# Patient Record
Sex: Male | Born: 1965 | Race: White | Hispanic: No | Marital: Married | Smoking: Never smoker
Health system: Southern US, Community
[De-identification: ages and names within clinical notes are randomized; demographics above are authoritative.]

## PROBLEM LIST (undated history)

## (undated) DIAGNOSIS — E875 Hyperkalemia: Secondary | ICD-10-CM

## (undated) DIAGNOSIS — R519 Headache, unspecified: Secondary | ICD-10-CM

## (undated) DIAGNOSIS — Z789 Other specified health status: Secondary | ICD-10-CM

## (undated) DIAGNOSIS — I4891 Unspecified atrial fibrillation: Secondary | ICD-10-CM

## (undated) DIAGNOSIS — Z86718 Personal history of other venous thrombosis and embolism: Secondary | ICD-10-CM

## (undated) DIAGNOSIS — N200 Calculus of kidney: Secondary | ICD-10-CM

## (undated) DIAGNOSIS — M1990 Unspecified osteoarthritis, unspecified site: Secondary | ICD-10-CM

## (undated) DIAGNOSIS — I10 Essential (primary) hypertension: Secondary | ICD-10-CM

## (undated) DIAGNOSIS — Z860101 Personal history of adenomatous and serrated colon polyps: Secondary | ICD-10-CM

## (undated) DIAGNOSIS — R0602 Shortness of breath: Secondary | ICD-10-CM

## (undated) DIAGNOSIS — G629 Polyneuropathy, unspecified: Secondary | ICD-10-CM

## (undated) DIAGNOSIS — Z86010 Personal history of colonic polyps: Secondary | ICD-10-CM

## (undated) DIAGNOSIS — K219 Gastro-esophageal reflux disease without esophagitis: Secondary | ICD-10-CM

## (undated) DIAGNOSIS — G4730 Sleep apnea, unspecified: Secondary | ICD-10-CM

## (undated) DIAGNOSIS — R600 Localized edema: Secondary | ICD-10-CM

## (undated) DIAGNOSIS — E039 Hypothyroidism, unspecified: Secondary | ICD-10-CM

## (undated) DIAGNOSIS — N289 Disorder of kidney and ureter, unspecified: Secondary | ICD-10-CM

## (undated) DIAGNOSIS — K5790 Diverticulosis of intestine, part unspecified, without perforation or abscess without bleeding: Secondary | ICD-10-CM

## (undated) DIAGNOSIS — R51 Headache: Secondary | ICD-10-CM

## (undated) DIAGNOSIS — R6884 Jaw pain: Secondary | ICD-10-CM

## (undated) HISTORY — PX: STAPEDES SURGERY: SHX789

## (undated) HISTORY — PX: TONSILLECTOMY: SUR1361

## (undated) HISTORY — PX: URETHROTOMY: SHX1083

## (undated) HISTORY — DX: Personal history of adenomatous and serrated colon polyps: Z86.0101

## (undated) HISTORY — DX: Personal history of other venous thrombosis and embolism: Z86.718

## (undated) HISTORY — PX: HERNIA REPAIR: SHX51

## (undated) HISTORY — DX: Sleep apnea, unspecified: G47.30

## (undated) HISTORY — DX: Personal history of colonic polyps: Z86.010

## (undated) HISTORY — PX: CARDIOVERSION: SHX1299

## (undated) HISTORY — PX: CHOLECYSTECTOMY: SHX55

## (undated) HISTORY — DX: Headache, unspecified: R51.9

## (undated) HISTORY — DX: Diverticulosis of intestine, part unspecified, without perforation or abscess without bleeding: K57.90

## (undated) HISTORY — DX: Gastro-esophageal reflux disease without esophagitis: K21.9

## (undated) HISTORY — PX: SHOULDER ARTHROSCOPY: SHX128

## (undated) HISTORY — DX: Polyneuropathy, unspecified: G62.9

## (undated) HISTORY — DX: Unspecified osteoarthritis, unspecified site: M19.90

## (undated) HISTORY — DX: Headache: R51

## (undated) HISTORY — DX: Unspecified atrial fibrillation: I48.91

## (undated) HISTORY — DX: Morbid (severe) obesity due to excess calories: E66.01

## (undated) HISTORY — PX: KNEE ARTHROSCOPY: SUR90

## (undated) HISTORY — DX: Calculus of kidney: N20.0

---

## 1998-03-02 ENCOUNTER — Emergency Department (HOSPITAL_COMMUNITY): Admission: EM | Admit: 1998-03-02 | Discharge: 1998-03-02 | Payer: Self-pay | Admitting: Emergency Medicine

## 1998-05-05 ENCOUNTER — Ambulatory Visit (HOSPITAL_COMMUNITY): Admission: RE | Admit: 1998-05-05 | Discharge: 1998-05-05 | Payer: Self-pay | Admitting: Family Medicine

## 1999-08-22 ENCOUNTER — Encounter: Payer: Self-pay | Admitting: *Deleted

## 1999-08-22 ENCOUNTER — Emergency Department (HOSPITAL_COMMUNITY): Admission: EM | Admit: 1999-08-22 | Discharge: 1999-08-22 | Payer: Self-pay | Admitting: Emergency Medicine

## 2000-01-30 ENCOUNTER — Encounter: Admission: RE | Admit: 2000-01-30 | Discharge: 2000-04-29 | Payer: Self-pay | Admitting: Family Medicine

## 2000-07-30 ENCOUNTER — Observation Stay (HOSPITAL_COMMUNITY): Admission: EM | Admit: 2000-07-30 | Discharge: 2000-08-03 | Payer: Self-pay | Admitting: Emergency Medicine

## 2000-08-02 ENCOUNTER — Encounter: Payer: Self-pay | Admitting: *Deleted

## 2000-08-03 ENCOUNTER — Encounter: Payer: Self-pay | Admitting: *Deleted

## 2001-02-14 ENCOUNTER — Observation Stay (HOSPITAL_COMMUNITY): Admission: RE | Admit: 2001-02-14 | Discharge: 2001-02-15 | Payer: Self-pay | Admitting: Urology

## 2001-07-17 ENCOUNTER — Ambulatory Visit (HOSPITAL_BASED_OUTPATIENT_CLINIC_OR_DEPARTMENT_OTHER): Admission: RE | Admit: 2001-07-17 | Discharge: 2001-07-17 | Payer: Self-pay | Admitting: Pulmonary Disease

## 2001-08-02 ENCOUNTER — Encounter: Admission: RE | Admit: 2001-08-02 | Discharge: 2001-08-02 | Payer: Self-pay | Admitting: Occupational Medicine

## 2001-08-02 ENCOUNTER — Encounter: Payer: Self-pay | Admitting: Occupational Medicine

## 2001-08-13 ENCOUNTER — Encounter: Admission: RE | Admit: 2001-08-13 | Discharge: 2001-11-11 | Payer: Self-pay | Admitting: Occupational Medicine

## 2003-10-01 ENCOUNTER — Observation Stay (HOSPITAL_COMMUNITY): Admission: RE | Admit: 2003-10-01 | Discharge: 2003-10-02 | Payer: Self-pay | Admitting: Urology

## 2003-10-07 ENCOUNTER — Emergency Department (HOSPITAL_COMMUNITY): Admission: EM | Admit: 2003-10-07 | Discharge: 2003-10-07 | Payer: Self-pay | Admitting: Emergency Medicine

## 2003-12-23 ENCOUNTER — Inpatient Hospital Stay (HOSPITAL_COMMUNITY): Admission: EM | Admit: 2003-12-23 | Discharge: 2003-12-28 | Payer: Self-pay | Admitting: Emergency Medicine

## 2004-05-18 ENCOUNTER — Ambulatory Visit (HOSPITAL_COMMUNITY): Admission: RE | Admit: 2004-05-18 | Discharge: 2004-05-18 | Payer: Self-pay | Admitting: Otolaryngology

## 2004-05-25 ENCOUNTER — Encounter: Admission: RE | Admit: 2004-05-25 | Discharge: 2004-05-25 | Payer: Self-pay | Admitting: *Deleted

## 2005-10-03 ENCOUNTER — Ambulatory Visit (HOSPITAL_COMMUNITY): Admission: RE | Admit: 2005-10-03 | Discharge: 2005-10-03 | Payer: Self-pay | Admitting: Urology

## 2006-06-11 ENCOUNTER — Emergency Department (HOSPITAL_COMMUNITY): Admission: EM | Admit: 2006-06-11 | Discharge: 2006-06-11 | Payer: Self-pay | Admitting: Family Medicine

## 2006-07-25 ENCOUNTER — Ambulatory Visit: Payer: Self-pay | Admitting: Gastroenterology

## 2006-08-15 ENCOUNTER — Ambulatory Visit: Payer: Self-pay | Admitting: Gastroenterology

## 2006-08-28 ENCOUNTER — Encounter (INDEPENDENT_AMBULATORY_CARE_PROVIDER_SITE_OTHER): Payer: Self-pay | Admitting: *Deleted

## 2006-08-28 ENCOUNTER — Ambulatory Visit (HOSPITAL_COMMUNITY): Admission: RE | Admit: 2006-08-28 | Discharge: 2006-08-28 | Payer: Self-pay | Admitting: Gastroenterology

## 2006-08-30 ENCOUNTER — Ambulatory Visit: Payer: Self-pay | Admitting: Gastroenterology

## 2006-12-02 ENCOUNTER — Emergency Department (HOSPITAL_COMMUNITY): Admission: EM | Admit: 2006-12-02 | Discharge: 2006-12-02 | Payer: Self-pay | Admitting: Emergency Medicine

## 2007-01-21 ENCOUNTER — Encounter (INDEPENDENT_AMBULATORY_CARE_PROVIDER_SITE_OTHER): Payer: Self-pay | Admitting: Surgery

## 2007-01-21 ENCOUNTER — Ambulatory Visit (HOSPITAL_COMMUNITY): Admission: RE | Admit: 2007-01-21 | Discharge: 2007-01-22 | Payer: Self-pay | Admitting: Surgery

## 2007-02-04 ENCOUNTER — Inpatient Hospital Stay (HOSPITAL_COMMUNITY): Admission: EM | Admit: 2007-02-04 | Discharge: 2007-02-08 | Payer: Self-pay | Admitting: Emergency Medicine

## 2007-02-04 ENCOUNTER — Ambulatory Visit: Payer: Self-pay | Admitting: Internal Medicine

## 2007-02-05 ENCOUNTER — Encounter: Payer: Self-pay | Admitting: Internal Medicine

## 2007-02-15 ENCOUNTER — Ambulatory Visit: Payer: Self-pay | Admitting: Gastroenterology

## 2007-06-18 ENCOUNTER — Encounter: Admission: RE | Admit: 2007-06-18 | Discharge: 2007-06-18 | Payer: Self-pay | Admitting: General Surgery

## 2007-07-24 ENCOUNTER — Inpatient Hospital Stay (HOSPITAL_COMMUNITY): Admission: RE | Admit: 2007-07-24 | Discharge: 2007-07-26 | Payer: Self-pay | Admitting: Surgery

## 2007-12-23 ENCOUNTER — Emergency Department (HOSPITAL_COMMUNITY): Admission: EM | Admit: 2007-12-23 | Discharge: 2007-12-23 | Payer: Self-pay | Admitting: Emergency Medicine

## 2008-02-11 ENCOUNTER — Encounter (INDEPENDENT_AMBULATORY_CARE_PROVIDER_SITE_OTHER): Payer: Self-pay | Admitting: *Deleted

## 2008-02-11 ENCOUNTER — Encounter: Admission: RE | Admit: 2008-02-11 | Discharge: 2008-02-11 | Payer: Self-pay | Admitting: Urology

## 2008-02-11 ENCOUNTER — Encounter (INDEPENDENT_AMBULATORY_CARE_PROVIDER_SITE_OTHER): Payer: Self-pay | Admitting: Interventional Radiology

## 2008-02-11 ENCOUNTER — Other Ambulatory Visit: Admission: RE | Admit: 2008-02-11 | Discharge: 2008-02-11 | Payer: Self-pay | Admitting: Interventional Radiology

## 2008-11-23 ENCOUNTER — Telehealth: Payer: Self-pay | Admitting: Gastroenterology

## 2008-11-25 DIAGNOSIS — E039 Hypothyroidism, unspecified: Secondary | ICD-10-CM | POA: Insufficient documentation

## 2008-11-25 DIAGNOSIS — M129 Arthropathy, unspecified: Secondary | ICD-10-CM | POA: Insufficient documentation

## 2008-11-25 DIAGNOSIS — G4730 Sleep apnea, unspecified: Secondary | ICD-10-CM | POA: Insufficient documentation

## 2008-11-25 DIAGNOSIS — K219 Gastro-esophageal reflux disease without esophagitis: Secondary | ICD-10-CM | POA: Insufficient documentation

## 2009-01-05 ENCOUNTER — Encounter: Admission: RE | Admit: 2009-01-05 | Discharge: 2009-01-05 | Payer: Self-pay | Admitting: Urology

## 2009-06-28 ENCOUNTER — Encounter: Admission: RE | Admit: 2009-06-28 | Discharge: 2009-06-28 | Payer: Self-pay | Admitting: Urology

## 2009-07-20 ENCOUNTER — Inpatient Hospital Stay (HOSPITAL_COMMUNITY): Admission: AD | Admit: 2009-07-20 | Discharge: 2009-07-21 | Payer: Self-pay | Admitting: Orthopedic Surgery

## 2009-07-22 ENCOUNTER — Encounter: Admission: RE | Admit: 2009-07-22 | Discharge: 2009-10-20 | Payer: Self-pay | Admitting: Orthopedic Surgery

## 2010-06-27 ENCOUNTER — Encounter: Payer: Self-pay | Admitting: Urology

## 2010-07-07 NOTE — Progress Notes (Signed)
Summary: TRIAGE-ABD. PAIN  Phone Note Call from Patient Call back at Work Phone [phone]   Caller: Patient Call For: Jon Carson  Reason for Call: Talk to Nurse, Privacy/Consent Authorization Summary of Call: dull aching pain x1 week in pelvic area Initial call taken by: Guadlupe Spanish [facility],  November 23, 2008 9:57 AM  Follow-up for Phone Call        Pt. has history of diverticulitis and perf. in 2005.  Pt. c/o a dull ache in lower,mid. abd. X1 week.  Ache has been the same for 1 week. Denies fever, n/v, constipation,diarrhea, blood,black stools.  DR.Natash Berman PLEASE ADVISE Follow-up by: Laureen Ochs LPN,  November 23, 2008 10:11 AM  Additional Follow-up for Phone Call Additional follow up Details #1::        hyomax 0.25 q4h as needed ov in no better next 36 hrs  Additional Follow-up by: Louis Meckel MD,  November 23, 2008 2:36 PM      Appended Document: Med. Update Above MD orders reviewed with patient. Pt. will start Hyomax and see Dr.Londin Antone on 11-27-08. Pt. instructed to call back as needed.    Clinical Lists Changes  Medications: Added new medication of HYOSCYAMINE SULFATE 0.125 MG  SUBL (HYOSCYAMINE SULFATE) Put one under your tongue every 4 hours as needed for abd. pain - Signed Rx of HYOSCYAMINE SULFATE 0.125 MG  SUBL (HYOSCYAMINE SULFATE) Put one under your tongue every 4 hours as needed for abd. pain;  #30 x 2;  Signed;  Entered by: Laureen Ochs LPN;  Authorized by: Louis Meckel MD;  Method used: Faxed to [facility], [address], [place], [place], [place]  [address], Ph: [phone] or [phone], Fax: [phone]    Prescriptions: HYOSCYAMINE SULFATE 0.125 MG  SUBL (HYOSCYAMINE SULFATE) Put one under your tongue every 4 hours as needed for abd. pain  #30 x 2   Entered by:   Laureen Ochs LPN   Authorized by:   Louis Meckel MD   Signed by:   Laureen Ochs LPN on 25/95/6387   Method used:   Faxed to ...       [facility] (retail)        [address]       [place], [place]  [address]       Ph: [phone] or [phone]       Fax: [phone]   RxID:   [id number]

## 2010-08-26 LAB — URINE CULTURE: Colony Count: 55000

## 2010-08-26 LAB — CBC
HCT: 41.1 % (ref 39.0–52.0)
HCT: 45.8 % (ref 39.0–52.0)
Hemoglobin: 14.1 g/dL (ref 13.0–17.0)
Hemoglobin: 15.9 g/dL (ref 13.0–17.0)
MCHC: 34.2 g/dL (ref 30.0–36.0)
MCHC: 34.7 g/dL (ref 30.0–36.0)
MCV: 92 fL (ref 78.0–100.0)
MCV: 92 fL (ref 78.0–100.0)
Platelets: 285 10*3/uL (ref 150–400)
Platelets: 305 10*3/uL (ref 150–400)
RBC: 4.46 MIL/uL (ref 4.22–5.81)
RBC: 4.98 MIL/uL (ref 4.22–5.81)
RDW: 13.2 % (ref 11.5–15.5)
RDW: 13.9 % (ref 11.5–15.5)
WBC: 10.5 10*3/uL (ref 4.0–10.5)
WBC: 8.6 10*3/uL (ref 4.0–10.5)

## 2010-08-26 LAB — BASIC METABOLIC PANEL
BUN: 15 mg/dL (ref 6–23)
CO2: 27 mEq/L (ref 19–32)
Calcium: 8.2 mg/dL — ABNORMAL LOW (ref 8.4–10.5)
Chloride: 101 mEq/L (ref 96–112)
Creatinine, Ser: 0.95 mg/dL (ref 0.4–1.5)
GFR calc Af Amer: >60 mL/min (ref >60)
GFR calc non Af Amer: >60 mL/min (ref >60)
Glucose, Bld: 119 mg/dL — ABNORMAL HIGH (ref 70–99)
Potassium: 3.8 mEq/L (ref 3.5–5.1)
Sodium: 137 mEq/L (ref 135–145)

## 2010-08-26 LAB — COMPREHENSIVE METABOLIC PANEL
ALT: 24 U/L (ref 0–53)
AST: 25 U/L (ref 0–37)
Albumin: 3.9 g/dL (ref 3.5–5.2)
Alkaline Phosphatase: 69 U/L (ref 39–117)
BUN: 13 mg/dL (ref 6–23)
CO2: 29 mEq/L (ref 19–32)
Calcium: 8.8 mg/dL (ref 8.4–10.5)
Chloride: 101 mEq/L (ref 96–112)
Creatinine, Ser: 0.88 mg/dL (ref 0.4–1.5)
GFR calc Af Amer: >60 mL/min (ref >60)
GFR calc non Af Amer: >60 mL/min (ref >60)
Glucose, Bld: 91 mg/dL (ref 70–99)
Potassium: 3.9 mEq/L (ref 3.5–5.1)
Sodium: 138 mEq/L (ref 135–145)
Total Bilirubin: 0.6 mg/dL (ref 0.3–1.2)
Total Protein: 6.9 g/dL (ref 6.0–8.3)

## 2010-08-26 LAB — DIFFERENTIAL
Basophils Absolute: 0 10*3/uL (ref 0.0–0.1)
Basophils Relative: 1 % (ref 0–1)
Eosinophils Absolute: 0.1 10*3/uL (ref 0.0–0.7)
Eosinophils Relative: 1 % (ref 0–5)
Lymphocytes Relative: 37 % (ref 12–46)
Lymphs Abs: 3.2 10*3/uL (ref 0.7–4.0)
Monocytes Absolute: 0.7 10*3/uL (ref 0.1–1.0)
Monocytes Relative: 9 % (ref 3–12)
Neutro Abs: 4.5 10*3/uL (ref 1.7–7.7)
Neutrophils Relative %: 53 % (ref 43–77)

## 2010-08-26 LAB — URINALYSIS, ROUTINE W REFLEX MICROSCOPIC
Bilirubin Urine: NEGATIVE
Glucose, UA: NEGATIVE mg/dL
Hgb urine dipstick: NEGATIVE
Ketones, ur: NEGATIVE mg/dL
Nitrite: NEGATIVE
Protein, ur: NEGATIVE mg/dL
Specific Gravity, Urine: 1.017 (ref 1.005–1.030)
Urobilinogen, UA: 1 mg/dL (ref 0.0–1.0)
pH: 6 (ref 5.0–8.0)

## 2010-08-26 LAB — APTT: aPTT: 29 seconds (ref 24–37)

## 2010-08-26 LAB — PROTIME-INR
INR: 0.99 (ref 0.00–1.49)
Prothrombin Time: 13 seconds (ref 11.6–15.2)

## 2010-10-18 NOTE — H&P (Signed)
NAME:  Jon Carson, STIGGERS            ACCOUNT NO.:  [id number]   MEDICAL RECORD NO.:  [id number]          PATIENT TYPE:  INP   LOCATION:  1308                         FACILITY:  [facility]   PHYSICIAN:  Corwin Levins, MD      DATE OF BIRTH:  09-Mar-1966   DATE OF ADMISSION:  02/03/2007  DATE OF DISCHARGE:                              HISTORY & PHYSICAL   CHIEF COMPLAINT:  Right upper quadrant and right lateral chest pain.   HISTORY OF PRESENT ILLNESS:  Mr. Harbuck is a 45 year old white male  status post laparoscopic cholecystectomy August 18 who is here with the  above.  He is simply too large for CT imaging or VQ scan at this time.  The pain seems sharp with deep breaths and moving about.  It was  intermittent to start with a week ago, but now more constant, dull and  severe.  He has no shortness of breath or lower extremity swelling.  No  fever, as well.  There has been some nausea but no other GI complaints.   PAST MEDICAL HISTORY:  1. History of diverticulitis with spontaneous performation 2005.  2. History of left renal cyst.  3. GERD.  4. Questionable hypertension, mentioned on the record but no evidence      of treatment.  5. Hypothyroidism.  6. DJD.  7. Obstructive sleep apnea with morbid obesity.  8. Questionable asthma.  9. Status post urethrotomy x3.  10.Status post lower extremity joint surgery.  11.Status post ear surgery.   ALLERGIES:  No known drug allergies.   CURRENT MEDICATIONS:  1. Synthroid 0.3 mg p.o. daily.  2. Etodolac 400 mg b.i.d.  3. Prilosec 20 mg p.o. daily p.r.n.   SOCIAL HISTORY:  No tobacco, no alcohol, married, works for the [employer].   FAMILY HISTORY:  Father deceased at age 43 with MI.  Uncle deceased at  29 with MI.  Father with diabetes, hypertension and peripheral vascular  disease.   PHYSICAL EXAMINATION:  VITAL SIGNS:  Blood pressure 148/88, respirations  20, pulse 74, O2 saturation 97%.  HEENT:  Sclerae are clear  TMs clear.   Pharynx benign.  NECK:  Without lymphadenopathy, JVD, thyromegaly.  CHEST:  No rales or wheezes.  CARDIAC:  Regular rate and rhythm.  ABDOMEN:  Soft, nontender, positive bowel sounds.  There is moderate  tenderness of the right lateral chest wall near the right upper quadrant  and right chest.  EXTREMITIES:  No edema.   LABORATORY DATA:  Lipase 22, electrolytes within normal limits.  BUN 16,  creatinine 1.0, glucose 102.  LFTs within normal limits.  UA negative.  White blood cell count 11.7, hemoglobin 12.2. Abdomen ultrasound no  acute disease status post cholecystectomy.  No biliary dilatation or  retained stone.  He has persistent mild splenomegaly, acute abdominal  series with chest x-ray in no acute disease.   ASSESSMENT/PLAN:  1. Right upper quadrant pain/chest pain, question musculoskeletal      versus other.  Most likely musculoskeletal obtained by exam      history, though, cannot rule out right upper quadrant pathology  with his recent surgery or even PE, though, the latter seems      unlikely.  He is to be admitted for IV fluids, pain control and      should likely have GI consultation, gastric problems or need for      EGD.  Will continue a Etodolac p.r.n.  2. Hypothyroidism.  Check TSH.  3. Prophylaxis.  Give Lovenox subcu and PPI therapy.  4. Other medical problems, otherwise, as above.      Corwin Levins, MD  Electronically Signed     JWJ/MEDQ  D:  02/04/2007  T:  02/04/2007  Job:  [id number]   cc:   Bryan Lemma. Manus Gunning, M.D.  Fax: [phone]

## 2010-10-18 NOTE — Op Note (Signed)
NAME:  Jon Carson, Jon Carson            ACCOUNT NO.:  [id number]   MEDICAL RECORD NO.:  [id number]          PATIENT TYPE:  INP   LOCATION:  1535                         FACILITY:  [facility]   PHYSICIAN:  Wilmon Arms. Corliss Skains, M.D. DATE OF BIRTH:  12/30/1965   DATE OF PROCEDURE:  07/24/2007  DATE OF DISCHARGE:                               OPERATIVE REPORT   PREOPERATIVE DIAGNOSES:  1. Ventral incisional hernia.  2. Super morbid obesity.   POSTOPERATIVE DIAGNOSES:  1. Ventral incisional hernia.  2. Super morbid obesity.   PROCEDURE:  Open ventral hernia repair with mesh.   SURGEON:  Wilmon Arms. Corliss Skains, M.D.   ASSISTANT:  Clovis Pu. Cornett, M.D.   ANESTHESIA:  General endotracheal.   INDICATIONS:  The patient is a 45 year old male with super morbid  obesity status post a laparoscopic cholecystectomy in 2008.  His  supraumbilical camera port site was closed with Vicryl suture, but the  patient has developed a ventral hernia in this area.  There is no sign  of incarceration.  A CT scan showed a ventral hernia containing a loop  of nondistended nonobstructed small bowel.  We recommended open repair.   DESCRIPTION OF PROCEDURE:  The patient was brought to the operating  placed and placed in the supine position on the operating room table.  After an adequate level of general anesthesia was obtained,  the  patient's abdomen was shaved, prepped with Betadine, and draped in  sterile fashion.  A timeout was taken to assure the proper patient and  proper procedure.  He had been given preoperative antibiotics.   An elliptical incision was made around his previous incision.  Dissection was carried down into the subcutaneous tissues with cautery.  We identified the hernia sac.  We were able to bluntly dissect around  the entire hernia sac which was fairly large.  We dissected this down to  the fascial opening.  We dissected completely around the fascial opening  and were able to reduce the hernia sac  back into the preperitoneal  space.  We never violated the peritoneum.  The preperitoneal space was  opened up in all directions for several centimeters.  The hernia defect  measured 3 x 6 cm, oriented in a transverse fashion.  We used a 10 x 15-  cm sheet of  Proceed mesh.  This was cut to fit in a round shape.  This  was then placed in the preperitoneal space and secured with multiple  interrupted 0 Prolene sutures.  These were tied down to suspend the mesh  from the posterior surface of the fascia.  The subcutaneous tissues were  then thoroughly irrigated.  The fascia was reapproximated anterior to  the mesh with #1 Prolene.  The subcutaneous tissues were then closed  with 3-0 Vicryl.  Staples were used to close skin.  The patient then had  a clean dressing applied.   He was extubated and brought to the recovery room in stable condition.  An abdominal binder was placed around his abdomen.      Wilmon Arms. Tsuei, M.D.  Electronically Signed     MKT/MEDQ  D:  07/24/2007  T:  07/25/2007  Job:  [id number]   cc:   Bryan Lemma. Manus Gunning, M.D.  Fax: [phone]

## 2010-10-18 NOTE — Op Note (Signed)
NAME:  Jon Carson, Jon Carson            ACCOUNT NO.:  [id number]   MEDICAL RECORD NO.:  [id number]          PATIENT TYPE:  OIB   LOCATION:  1527                         FACILITY:  [facility]   PHYSICIAN:  Wilmon Arms. Corliss Skains, M.D. DATE OF BIRTH:  [birth year]   DATE OF PROCEDURE:  DATE OF DISCHARGE:                               OPERATIVE REPORT   PREOP DIAGNOSIS:  Chronic calculus cholecystitis.   POSTOPERATIVE DIAGNOSIS:  Chronic calculus cholecystitis.   PROCEDURE PERFORMED:  Laparoscopic cholecystectomy with intraoperative  cholangiogram.   SURGEON:  Wilmon Arms. Corliss Skains, M.D., FACS   ASSISTANT:  Lorne Skeens. Hoxworth, M.D.   ANESTHESIA:  General endotracheal.   INDICATIONS:  The patient is a super morbidly obese 45 year old male who  presents with several attacks of severe right upper quadrant pain  radiating through to his back.  This is associated with nausea.  Usually  this is related to eating fried or greasy foods.  Ultrasound showed  gallstones, but no sign of acute cholecystitis.  His preoperative liver  functions were normal.   DESCRIPTION OF PROCEDURE:  The patient was brought to the operating room  and placed in the supine position on the operating room table.  After an  adequate level of general anesthesia was obtained, the patient's abdomen  was prepped with Betadine and draped in sterile fashion.  The patient  has an open, skin lesion on the right side was abdomen; and this was  isolated with an OpSite dressing.  A vertical incision was made 4 cm  above the umbilicus.  The 11-mm OptiVu trocar was then used to advance  into the peritoneal cavity.   Pneumoperitoneum was obtained by insufflating CO2 maintaining a maximum  pressure of 15 mmHg.  The patient was tilted slightly to his left in  reverse Trendelenburg position.  An 11-mm port was placed in the  subxiphoid position.  Two 5-mm ports were placed in the right upper  quadrant.  The gallbladder was grasped with a clamp and  elevated.  The  patient had a large amount of adhesions to the surface of the  gallbladder.  These were taken down with cautery.  The gallbladder was  very large.   The entire operation was very difficult due to the patient's massive  size and having to work against the weight of his abdominal wall.  We  were able to identify the cystic duct.  We dissected around this  circumferentially.  An opening was created on the cystic duct after  ligating with the clip distally.  A Cook cholangiogram catheter was  inserted through a stab incision and threaded into the cystic duct, and  secured a clip.  Cholangiogram was obtained which showed good flow  proximally and distally with no sign of obstruction.  Contrast flowed  into the duodenum.  A catheter was removed.  The cystic duct was ligated  with clips and divided.  The cystic artery was ligated clips and  divided.   Cautery was then used to remove the gallbladder from the liver.  This  was also extremely difficult due to the patient's body habitus as well  as the large size of the gallbladder.  Some bile was spilled, but no  stones were noted.  We were able to dissect the gallbladder free from  the liver and placed it in an EndoCatch sac.  We reinspected the  gallbladder fossa.  Hemostasis was obtained with the cautery.  The right  upper quadrant was then thoroughly irrigated.  Hemostasis was good.  We  then pulled the EndoCatch sac up to the supraumbilical port site.  There  was a large stone in the fundus of the gallbladder which was too large  to allow for the removal of the gallbladder.  We had to enlarge the  fascial opening to remove the gallbladder.  It was sent for pathologic  examination.  The fascia was then closed with a figure-of-eight #0  Vicryl suture.  The skin incisions were closed with 4-0 Monocryl after  all the ports were removed and pneumoperitoneum was released.  Steri-  Strips and clean dressings were applied.  The  patient was then extubated  and brought to the recovery room in stable condition.  All sponge,  instrument, and needle counts were correct.      Wilmon Arms. Tsuei, M.D.  Electronically Signed     MKT/MEDQ  D:  01/21/2007  T:  01/21/2007  Job:  [id number]

## 2010-10-18 NOTE — Discharge Summary (Signed)
NAME:  LOFTON, LEON            ACCOUNT NO.:  [id number]   MEDICAL RECORD NO.:  [id number]          PATIENT TYPE:  INP   LOCATION:  1535                         FACILITY:  [facility]   PHYSICIAN:  Wilmon Arms. Corliss Skains, M.D. DATE OF BIRTH:  Oct 04, 1965   DATE OF ADMISSION:  07/24/2007  DATE OF DISCHARGE:  07/26/2007                               DISCHARGE SUMMARY   ADMISSION DIAGNOSIS:  Ventral incisional hernia.   DISCHARGE DIAGNOSIS:  Ventral incisional hernia.   PROCEDURE:  Open ventral hernia repair with mesh.   SURGEON:  Wilmon Arms. Corliss Skains, M.D.   BRIEF HISTORY:  Mr. Marcum is a 45 year old male with morbid obesity  who underwent a laparoscopic cholecystectomy last year.  He had  developed a hernia at his supraumbilical port site.  CT scan confirmed  that this hernia contained some small bowel but there was no sign of  obstruction.  He presents for elective repair.   HOSPITAL COURSE:  The patient was brought to the operating room on  July 24, 2007.  Through a midline incision  he underwent a mesh  repair of his hernia.  We used a 10 x 10 piece of Proceed mesh secured  with zero Prolene sutures.  Postoperatively the patient has done  reasonably well.  He had some issues with pain control the first night  and was using a PCA.  However, he began ambulating wearing an abdominal  binder.  On postop day #2 he was doing well.  He is being discharged  home with his abdominal binder.  He is given Percocet for pain.   DISCHARGE INSTRUCTIONS:  Percocet p.r.n., follow up in 1 week for staple  removal.  Dr. Fatima Sanger office will call with the appointment.  He should  refrain from any heavy lifting.  He may shower over the dressing and his  wife will change the gauze after the shower.      Wilmon Arms. Tsuei, M.D.  Electronically Signed     MKT/MEDQ  D:  07/26/2007  T:  07/26/2007  Job:  [id number]

## 2010-10-18 NOTE — Discharge Summary (Signed)
NAME:  DIALLO, PONDER            ACCOUNT NO.:  [id number]   MEDICAL RECORD NO.:  [id number]          PATIENT TYPE:  INP   LOCATION:  1308                         FACILITY:  [facility]   PHYSICIAN:  Corinna L. Lendell Caprice, MDDATE OF BIRTH:  1965/11/11   DATE OF ADMISSION:  02/03/2007  DATE OF DISCHARGE:  02/08/2007                               DISCHARGE SUMMARY   DISCHARGE DIAGNOSES:  1. Right upper quadrant and right lateral chest pain, musculoskeletal.  2. Recent laparoscopic cholecystectomy for chronic calculous      cholecystitis.  3. Morbid obesity.  4. Gastroesophageal reflux disease.  5. Hypothyroidism.  6. Obstructive sleep apnea.   DISCHARGE MEDICATIONS:  Continue Synthroid, etodolac, Prilosec as  previous.   ACTIVITY:  No heavy lifting until cleared by Dr. Corliss Skains. Follow up with  Dr. Corliss Skains next week.   PROCEDURES:  None.   CONSULTATIONS:  Dr. Melvia Heaps and Dr. Corliss Skains.   DIET:  Low calorie.   CONDITION:  Stable.   LABORATORY DATA:  Complete metabolic panel significant for an albumin of  3.4 otherwise unremarkable. White blood cell count on admission was  11.7, hemoglobin 12.2, hematocrit 36, platelet count 473.  The white  blood cell count normalized without any antibiotics or other treatment.  Urinalysis showed moderate bilirubin, negative ketone, negative nitrite,  negative leukocyte esterase.  TSH 4.762.   SPECIAL STUDIES AND RADIOLOGY:  Acute abdominal series showed nothing  remarkable. Ultrasound of the abdomen showed no abnormal fluid  collections, common bile duct normal, no intrahepatic ductal dilatation,  left renal cyst, mild splenomegaly. VQ scan showed low probability.  Two  views of the chest showed low lung volumes, mild cardiomegaly and  vascular congestion. HIDA scan showed no evidence of bile leak.   HISTORY AND HOSPITAL COURSE:  Mr. Rubin is a 45 year old, white male  who weighs over 500 pounds and had a recent laparoscopic cholecystectomy  on January 21, 2007.  He presented to the emergency room with severe  right upper quadrant and right lateral chest pain. Please see H&P for  complete admission details.  The pain was sharp and worsened with deep  breaths and any activity.  It had started a week prior to admission and  initially was intermittent but now is constant and severe. He had a  blood pressure of 148/88 but otherwise unremarkable vital signs.  His  lung exam and heart exam were normal.  He had some tenderness in the  right lateral chest wall and right upper quadrant and right chest. The  surgeon on call, Dr. Simona Huh, was contacted by the ED staff and this was  felt to be unlikely a surgical issue and the patient was therefore  admitted to medicine for further workup. Due to his morbid obesity,  getting a CT angiogram to rule out PE was out of the question. Luckily  we were able to get a VQ scan at [facility]. This showed low  probability. Surgery was contacted by Dr. Suanne Marker.  She spoke with Dr.  Derrell Lolling who recommended getting a GI consult and that this was unlikely  to be related to surgery  but the patient may need an ERCP. [facility]  was consulted and recommended a HIDA scan. Also Dr. Corliss Skains consulted. The  patient did have some drainage from his periumbilical incision.  There  was no signs of infection. HIDA scan showed no bile leak. The patient's  pain had improved and it is felt to be musculoskeletal in nature.      Corinna L. Lendell Caprice, MD  Electronically Signed     CLS/MEDQ  D:  02/08/2007  T:  02/08/2007  Job:  [id number]   cc:   Bryan Lemma. Manus Gunning, M.D.  Fax: [phone]   Barbette Hair. Arlyce Dice, MD,FACG  [address]  [place]  [place] [address]   Wilmon Arms. Corliss Skains, M.D.  [address] [place] [phone]  [place]

## 2010-10-21 NOTE — Assessment & Plan Note (Signed)
[facility]                         GASTROENTEROLOGY OFFICE NOTE   NAME:Jon Carson, [name]                   MRN:          [id number]  DATE:07/25/2006                            DOB:          02-25-1966    PROBLEM:  History of diverticulitis.   Mr. Verrilli is a pleasant, 45 year old white male, here for evaluation  of his diverticular disease.  He suffered a spontaneous perforation of a  diverticulum in 2005.  He was treated medically.  He has been on a low-  fiber diet since.  He has had no subsequent episodes of abdominal pain.  He has occasional pyrosis for which he takes omeprazole.   FAMILY HISTORY:  Pertinent for his grandfather, who had colon cancer.   PAST MEDICAL HISTORY:  Pertinent for thyroid disease, arthritis and  sleep apnea.  He is status post urethrotomy.   MEDICATIONS INCLUDE:  Omeprazole, etodolac and Synthroid.   ALLERGIES:  He has no allergies.   SOCIAL HISTORY:  He neither smokes nor drinks.  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   REVIEW OF SYSTEMS:  Positive for feet swelling, joint pains and back  pain.   PHYSICAL EXAMINATION:  He is an obese male.  Pulse 68, blood pressure  130/84, weight 481.  HEENT: EOMI. PERRLA. Sclerae are anicteric.  Conjunctivae are pink.  NECK:  Supple without thyromegaly, adenopathy or carotid bruits.  CHEST:  Clear to auscultation and percussion without adventitious  sounds.  CARDIAC:  Regular rhythm; normal S1 S2.  There are no murmurs, gallops  or rubs.  ABDOMEN:  Bowel sounds are normoactive.  Abdomen is soft, non-tender and  non-distended.  There are no abdominal masses, tenderness, splenic  enlargement or hepatomegaly.  EXTREMITIES:  Full range of motion.  No cyanosis, clubbing or edema.  RECTAL:  Deferred.   IMPRESSION:  1. History of perforated diverticulitis.  2. Morbid obesity.   RECOMMENDATION:  1. High-fiber diet.  2. Colonoscopy to assess  degree and severity of his diverticular      disease.  3. Patient was counseled about weight-loss and consideration for      bariatric surgery.     Barbette Hair. Arlyce Dice, MD,FACG  Electronically Signed    RDK/MedQ  DD: 07/25/2006  DT: 07/25/2006  Job #: [id number]   cc:   Bryan Lemma. Manus Gunning, M.D.

## 2010-10-21 NOTE — Op Note (Signed)
NAME:  Jon Carson, Jon Carson            ACCOUNT NO.:  [id number]   MEDICAL RECORD NO.:  [id number]          PATIENT TYPE:  AMB   LOCATION:  DAY                          FACILITY:  [facility]   PHYSICIAN:  Martina Sinner, MD DATE OF BIRTH:  07-Dec-1965   DATE OF PROCEDURE:  10/03/2005  DATE OF DISCHARGE:                                 OPERATIVE REPORT   PREOPERATIVE DIAGNOSIS:  Urethral stricture.   POSTOPERATIVE DIAGNOSIS:  Urethral stricture.   SURGERY:  Cystoscopy, retrograde urethrogram, and balloon dilation of  urethral stricture.   Mr. Summerlin has a symptomatic stricture that was dilated by Dr. Etta Grandchild a  few weeks ago to 16-French.  He has had three urethrotomies.  He is  approximately 450 pounds.  I decided to, under anesthesia, ,examine him and  get the length and caliber of the stricture.   The patient is prepped and draped in the usual fashion.  Initially, a 36-  Jamaica scope was utilized for the examination.  His entire penile and bulbar  urethra was very pale.  The caliber appeared to be nearly within normal  limits or within normal limits.  It was not fibrotic.  I recognized that he  was just dilated but the caliber looked reasonably normal.  He had an  erection during the procedure and because of that and his obesity, he was  actually difficult the cystoscope.  I finally did scope him to what I  thought was the proximal bulbar urethra where he had probably a 14-French  stricture near the membranous urethra.  I then did a retrograde urethrogram.  He looked to have a mild narrowing near the membranous urethra, but no other  obvious stricture.  The details of the retrograde urethrogram are dictated  below.   Under cystoscopic and fluoroscopic guidance, I then passed a sensor wire  into the bladder.  I radiographically placed the balloon dilation catheter  across the stricture.  I dilated to atmospheres of pressure for five  minutes.  I then re-cystoscoped the patient with  the wire still in place.  I  could easily pass through the urethra up through the membranous urethra to  the level of the verumontanum and bladder neck.  Because of the erection, I  could not enter the bladder with the regular length cystoscope.   I had a good look at his anatomy.  The prostatic urethra and verumontanum  was normal.  There is no question that the stricture was within the  membranous urethra.  It may have extended 0.5 cm distal to the sphincter but  it was primarily an intersphincteric stricture.  It was dilated visually to  approximately 22-24 [place].  The rest of the urethra, again, was open,  healthy, but pale-looking.  There is no other trauma to the urethra.  I  catheterized him fairly easily with an 18-French rubber catheter.  An 88-  Jamaica coude Foley catheter went in more easily.  I decided to leave it in  overnight so that he does not have any trouble with painful retention  postoperatively.   In my opinion, he did not have  obvious EXO of the glans penis.  Having said  that, he had a very pale urethra throughout its length.  He had reasonable  penile length for skin usage.  I did not put his legs in the high lithotomy  position but I did palpate his perineum.  I could palpate the Foley catheter  in place.   Mr. Ruggiero is going to have the catheter in overnight.  I will give him a  week of ciprofloxacin.  We are going to teach him how to self catheterize  himself and possibly have his wife help because of his body habitus.           ______________________________  Martina Sinner, MD  Electronically Signed     SAM/MEDQ  D:  10/03/2005  T:  10/03/2005  Job:  [id number]

## 2010-10-21 NOTE — Op Note (Signed)
[facility]  Patient:    Jon Carson, Jon Carson Visit Number: [id number] MRN: [id number]          Service Type: DSU Location: DAY Attending Physician:  Monica Becton Dictated by:   Claudette Laws, M.D. Proc. Date: 02/14/01 Admit Date:  02/14/2001                             Operative Report  PREOPERATIVE DIAGNOSES: 1. 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 2. Remote history of urethral stricture disease.  POSTOPERATIVE DIAGNOSES: 1. 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 2. Remote history of urethral stricture disease.  OPERATION PERFORMED:  Cystourethroscopy and urethral dilation with Sissy Hoff sounds.  SURGEON:  Claudette Laws, M.D.  HISTORY OF PRESENT ILLNESS:  This is a 45 year old patient who weighs over 400 pounds who I had performed a direct vision urethrotomy on six or seven years ago for urethral stricture disease. He was lost to follow-up and basically did well until recently he noticed some splaying of his stream. I saw him in the office recently and examined him and he appeared to have a meatal stenosis and some whitish discoloration possibly consistent with early BXO. A decision was made to put him to sleep and reevaluate him and possibly do another direct vision urethrotomy if indicated or urethral dilation depending upon the findings, possible meatotomy. All of this was explained to the patient. He understands and agrees to the proposed surgery.  DESCRIPTION OF PROCEDURE:  The patient was prepped and draped in the dorsal lithotomy position under intubated anesthesia. Examination again revealed a rather tight meatus; however, I was able to dilate this with [employer] sounds to a #28 [place]. The urethra was somewhat hemorrhagic. Then under direct vision, I initially passed a 17 Jamaica cystoscope. I was able to finally with the use of a guidewire negotiate my way into the bladder.  The urethra itself was somewhat snug throughout but there was no obvious areas I thought which warranted direct vision urethrotomy. The bladder itself looked normal. Grossly normal tumors nor calculi. Normal ureteral orifices. A small nonobstructing prostate, short prostatic urethra.  At this point, I thought that in view of the fact of these findings that maybe I would just dilate him with Sissy Hoff sounds and dilate the urethra and not do anything else at this time but follow him along. 10 cc of xylocaine jelly was instilled into the urethra. He was then taken back to the recovery room in satisfactory condition. Dictated by:   Claudette Laws, M.D. Attending Physician:  Monica Becton DD:  02/14/01 TD:  02/14/01 Job: [id number]

## 2010-10-21 NOTE — Discharge Summary (Signed)
NAME:  Jon Carson, Jon Carson                      ACCOUNT NO.:  [id number]   MEDICAL RECORD NO.:  [id number]                   PATIENT TYPE:  OBV   LOCATION:  0357                                 FACILITY:  [facility]   PHYSICIAN:  Claudette Laws, M.D.               DATE OF BIRTH:  13-Apr-1966   DATE OF ADMISSION:  10/01/2003  DATE OF DISCHARGE:  10/02/2003                                 DISCHARGE SUMMARY   HISTORY:  This is a 45 year old gentleman with a long history of urethral  stricture disease.  In the past, he has undergone two cystoscopies, urethral  dilatations, and urethrotomies under anesthesia.  Patient, because of his  massive obesity, has proven to be very difficult to cystoscope, even under  anesthesia.  Recently, he has developed some hesitancy and some burning.  I  tried to cystoscope him in the office without any success.  He comes back  now for another attempt at direction-vision urethrotomy under anesthesia.  The patient otherwise, other than the obesity, is in fairly good health.  He  uses a CPAP at bedtime.  Patient also has a history of hypertension.   PERTINENT LABORATORY DATA:  Electrolytes were normal with a BUN of 15,  creatinine of 1.0.  Hemoglobin 12.4, hematocrit 37.6.   EKG showed normal sinus rhythm with sinus arrhythmia.   HOSPITAL COURSE:  The patient came in on the morning of surgery and  underwent what proved to be a rather difficult direct-vision urethrotomy;  however, I was eventually able to negotiate a 17 French cystoscope into the  bladder.  The patient has a snug urethra throughout its course.  Otherwise,  the bladder neck itself was wide open.  The bladder itself looked grossly  normal.  At the conclusion, I was able to place a #16 [place] coude-tip  catheter, and the patient was then observed overnight because of his sleep  apnea.  He did well and was sent home the next morning.  The plan now is to  serially dilate him with catheters in the office.   He is to come back, now,  in three days for followup.   FINAL DIAGNOSES:  1. Chronic urethral stricture disease.  2. Massive obesity.  3. Hypertension.   OPERATION:  Cystoscopy and direct-vision urethrotomy.   COMPLICATIONS:  None.   CONDITION ON DISCHARGE:  Stable.   DISCHARGE MEDICATIONS:  To include all of his home medications plus his CPAP  plus Cipro XR 500 mg 1 daily #5 and also Tylox #25 p.r.n. pain.   DISPOSITION:  1. A regular diet.  2. Force fluids.  3. Catheter to a leg bag.  4. See me in the office in three days.  Claudette Laws, M.D.    RFS/MEDQ  D:  10/02/2003  T:  10/02/2003  Job:  [id number]

## 2010-10-21 NOTE — Discharge Summary (Signed)
NAME:  Jon Carson, Jon Carson            ACCOUNT NO.:  [id number]   MEDICAL RECORD NO.:  [id number]          PATIENT TYPE:  INP   LOCATION:  5707                         FACILITY:  [facility]   PHYSICIAN:  Vikki Ports, M.D.DATE OF BIRTH:  1965/07/12   DATE OF ADMISSION:  12/23/2003  DATE OF DISCHARGE:  12/28/2003                                 DISCHARGE SUMMARY   ADMISSION DIAGNOSES:  Ruptured diverticulitis.   DISCHARGE DIAGNOSES:  Ruptured diverticulitis.   CONDITION ON DISCHARGE:  Good and improved.   DISPOSITION:  Discharged to home.   FOLLOW UP:  With me in 1 to 2 weeks.   DISCHARGE MEDICATIONS:  1.  Ciprofloxacin 500 mg twice a day.  2.  Flagyl 500 mg three times a day.   HOSPITAL COURSE:  The patient is a 45 year old massively, morbidly obese  white male who was having significant abdominal pain for 5 to 6 days prior  to admission. He was admitted after being on outpatient Ciprofloxacin. CT  scan showed localized perforation in the left lower quadrant.  Clinically, the patient was very benign. Had a minimal white count of 12,000  and was afebrile, feeling well and was hungry. He was treated with  intravenous antibiotics over the next 4 to 5 days. He progressed nicely. Was  tolerating a low-residue, low-fiber diet and on postoperative day 5, was  ready for discharge home. He is going to followup with me, understanding  that with weight loss, I would be willing to do a staged laparoscopic  colectomy. He will followup with me in 2 to 3 weeks.       KRH/MEDQ  D:  02/27/2004  T:  02/28/2004  Job:  [id number]   cc:   Carren Rang, M.D.   Geoffry Paradise, M.D.  [address]  [place]  [place] [address]  Fax: [phone]

## 2010-10-21 NOTE — Op Note (Signed)
NAME:  RIAD, WAGLEY NO.:  [id number]   MEDICAL RECORD NO.:  [id number]          PATIENT TYPE:  OIB   LOCATION:  2899                         FACILITY:  [facility]   PHYSICIAN:  Kristine Garbe. 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   OPERATION:  Exploratory left tympanotomy with stapes mobilization.   SURGEON:  Kristine Garbe. Ezzard Standing, M.D.   ANESTHESIA:  General.   COMPLICATIONS:  None.   BRIEF CLINICAL NOTE:  Jon Carson is a 45 year old gentleman who has  had a gradual progressive hearing loss over the last three or four years in  his left ear.  Audiologic testing in the office demonstrated a 20-30 dB  conductive hearing loss in the left ear with some mild underlying  downsloping sensorineural hearing loss.  His speech perception threshold in  the left ear was 60 dB.  Speech perception in the right ear was 25 dB with  no conductive hearing loss.  He had bilateral type A tympanograms.  I  discussed with him concerning the conductive hearing loss to consider  exploratory tympanotomy and possible stapedectomy depending on findings.  He  is taken to the operating room at this time for exploratory tympanotomy and  possible stapedectomy.   DESCRIPTION OF PROCEDURE:  After adequate endotracheal anesthesia, the left  ear was prepped with Betadine solution and draped out with sterile towels.  A posterior-based tympanomeatal flap was elevated down to the annulus.  Cotton pledgets soaked in adrenalin were placed for hemostasis.  The annulus  was then entered and the middle ear space was opened.  The middle ear space  was dry.  I could visualize the tip of the incus and a portion of  the stapes  superstructure, although the patient had some overhanging bone that would  require a curette to visualize the stapedial tendon and the facial nerve.  Both the stapes was fixed with poor mobility.  Using a curette, the bone was  curetted on the posterior superior aspect.  The chorda tympani was  preserved, although during curetting it was stretched.  After curetting the  bone and obtaining the visualization of the facial nerve and stapedial  tendon, again the stapes was examined.  Still there was limited access  because of the mild overhanging bone after curetting as well as patient's  size.  Because of the limited visualization, it was elected to perform a  stapes mobilization.  Using a Rosen pick, the stapes was mobilized back and  forth until it seemed to have good mobility.  The IS joint was not  separated.  Following completion of the mobilization, the stapes seemed to  have much better mobility.  This completed the procedure.  The tympanomeatal  flap was brought back down.  The ear was packed with Gelfoam soaked in Coly-  Mycin and bacitracin ointment.  A cotton ball  and Band-Aid were placed over  the left ear.  Jon Carson tolerated this well, was awoken from anesthesia and  transferred to the recovery room postop doing well.   DISPOSITION:  Jon Carson will be discharged home either later this evening or  tomorrow morning.  The patient will be discharged home on Tylenol and  Vicodin p.r.n. pain.  Will have him follow up in my office in one week for  recheck and cleaning the ear canal.  I discussed with Jon Carson and his wife  concerning the stapes mobilization and probable need for further surgery  depending on hearing results down the road.       CEN/MEDQ  D:  05/18/2004  T:  05/18/2004  Job:  [id number]

## 2010-10-21 NOTE — Letter (Signed)
July 25, 2006    Legrand Como   RE:  HARALAMBOS, YEATTS  MRN:  [id number]  /  DOB:  31-Mar-1966   Dear Mr. Steller:   It is my pleasure to have treated you recently as a new patient in my  office.  I appreciate your confidence and the opportunity to participate  in your care.   Since I do have a busy inpatient endoscopy schedule and office schedule,  my office hours vary weekly.  I am, however, available for emergency  calls every day through my office.  If I cannot promptly meet an urgent  office appointment, another one of our gastroenterologists will be able  to assist you.   My well-trained staff are prepared to help you at all times.  For  emergencies after office hours, a physician from our gastroenterology  section is always available through my 24-hour answering service.   While you are under my care, I encourage discussion of your questions  and concerns, and I will be happy to return your calls as soon as I am  available.   Once again, I welcome you as a new patient and I look forward to a happy  and healthy relationship.    Sincerely,      Barbette Hair. Arlyce Dice, MD,FACG  Electronically Signed   RDK/MedQ  DD: 07/25/2006  DT: 07/25/2006  Job #: [id number]

## 2010-10-21 NOTE — Op Note (Signed)
NAME:  Jon Carson, Jon Carson            ACCOUNT NO.:  [id number]   MEDICAL RECORD NO.:  [id number]          PATIENT TYPE:  AMB   LOCATION:  DAY                          FACILITY:  [facility]   PHYSICIAN:  Martina Sinner, MD DATE OF BIRTH:  May 07, 1966   DATE OF PROCEDURE:  10/03/2005  DATE OF DISCHARGE:                                 OPERATIVE REPORT   PREOPERATIVE DIAGNOSIS:  Urethral stricture.   POSTOPERATIVE DIAGNOSIS:  Urethral stricture.   SURGERY:  Retrograde urethrogram.   SURGEON:  Martina Sinner, MD   DESCRIPTION OF PROCEDURE:  During cystoscopy, I performed a retrograde  urethrogram to outline the length and position of his stricture.  He  appeared to have about a 14-French narrowing near the membranous urethra.  The proximal penile urethra and bulbar urethra were normal.  Dye entered the  bladder.  I then balloon-dilated the patient's stricture.           ______________________________  Martina Sinner, MD  Electronically Signed     SAM/MEDQ  D:  10/03/2005  T:  10/04/2005  Job:  [id number]

## 2010-10-21 NOTE — Consult Note (Signed)
[facility]  Patient:    Carson, Jon                   MRN: [id number] Proc. Date: 08/01/00 Adm. Date:  [id number] Attending:  Jetty Duhamel T                          Consultation Report  REFERRING PHYSICIAN:  Jetty Duhamel, M.D.  REASON FOR CONSULTATION:  Chest pain.  HISTORY OF PRESENT ILLNESS:  Jon Carson is a 45 year old morbidly obese male who presented with the onset of chest pain for approximately 24 hours prior to presentation.  The pain was described as intermittent and was thought to be related to indigestion.  The patient self-treated himself at home with Pepto-Bismol with questionable relief.  The pain appeared to be aggravated by exercise and improved with rest.  He presented to the emergency room by the EMS service.  The pain was significantly relieved by O2 therapy.  The patient has had significant stress recently as he has travelled to [place] for a funeral.  Coronary risk factors are significant for male sex, morbid obesity, family history.  FAMILY HISTORY:  Mother passed at age 74 from myocardial infarction.  Maternal uncle at 43 years of age from myocardial infarction.  No history of hypertension.  He is a nonsmoker.  No diabetes.  Father has diabetes, hypertension, peripheral vascular disease.  PAST MEDICAL HISTORY:  Significant for morbid obesity, hypothyroidism, sleep apnea.  Past injuries are none.  REVIEW OF SYSTEMS:  He has had increasing pedal edema.  He has sleep apnea for which he must wear a C-Pap mask.  No change in bowel or bladder habits.  No tachyarrhythmia.  No palpitations.  No presyncope.  No syncope.  CURRENT MEDICATIONS: 1. Synthroid 0.2 plus 0.088 q.d. 2. Celebrex 200 mg q.d. 3. Glucosamine and C-Pap for sleep apnea.  PAST SURGICAL HISTORY:  None.  PHYSICAL EXAMINATION:  GENERAL:  Reveals a morbidly obese male.  Stated weight is 450 pounds.  VITAL SIGNS:  Blood pressure is  148/76, heart rate is 84.  His O2 saturation has been more than 90% on room air.  He is afebrile.  HEENT:  Unremarkable for xanthelasma.  NECK:  He has good carotid upstrokes.  There are no audible bruits.  Unable to evaluate for neck pain distention or thyroid.  LUNGS:  Pulmonary exam is made difficult by morbid obesity.  Breath sounds appear to be equal.  No use of accessory muscles.  CARDIOVASCULAR:  Heart tones are distant.  Regular rate and rhythm.  ABDOMEN:  Morbidly obese.  Unable to palpate for hepatosplenomegaly.  Normal bowel sounds.  EXTREMITIES:  There is 1+ pitting edema.  NEUROLOGICAL:  Nonfocal.  Orlene Erm is appropriate.  Motor is made difficult by his morbid obesity.  LABORATORY DATA:  Serial CKs have been negative.  Troponin Is have been negative.  Hematocrit is 41.9, platelet count is 376,000.  A pH of 7.41, pCO2 is 43, creatinine is 0.9, potassium is 3.7.  LDL is 136 and this was a nonfasting cholesterol profile.  Chest x-ray reveals a chronic obstructive pulmonary disease.  ECG reveals a normal sinus rhythm, normal ECG, normal R-wave progression.  IMPRESSION: 1. Chest pain, by history sounds typical for angina.  The patient has been    pain-free since his hospitalization.  He has significant risk factors.  A    weight will be obtained on the patient.  If his weight is less than 450    pounds, we will proceed with an Adenosine-Cardiolite.  If his weight is    more than 450 pounds, we would be unable to proceed with heart    catheterization.  As such, he would be treated as if he had known coronary    artery disease with the treatment of his LDL for a cholesterol less    than 100, aspirin, and initiation of beta blockers. 2. Morbid obesity:  The patient is aware of his risk of pulmonary    hypertension from his morbid obesity. 3. Questionable pulmonary embolus:  This has been eliminated by VQ scan    which was normal.  RECOMMENDATIONS:  Further  recommendations will be pending the outcome of the patients actual weight and studies. DD:  08/01/00 TD:  08/01/00 Job: [id number]

## 2010-10-21 NOTE — H&P (Signed)
NAME:  Jon Carson, Jon Carson                      ACCOUNT NO.:  [id number]   MEDICAL RECORD NO.:  [id number]                   PATIENT TYPE:  INP   LOCATION:  1825                                 FACILITY:  [facility]   PHYSICIAN:  Vikki Ports, M.D.         DATE OF BIRTH:  12/02/1965   DATE OF ADMISSION:  12/23/2003  DATE OF DISCHARGE:                                HISTORY & PHYSICAL   REFERRING PHYSICIAN:  Carren Rang, M.D.   REFERRAL DIAGNOSES:  1. Abdominal pain.  2. Perforated diverticulum.   HISTORY OF PRESENT ILLNESS:  The patient is a 45 year old morbidly-obese  white male who began having abdominal pain approximately five to six days  prior to presentation to his primary care physician.  He was seen by  Dr. Geoffry Paradise on Monday and started on ciprofloxacin.  The patient  began to have worsening discomfort over the last day, and presented to the  emergency room.  Workup here revealed a white count of 12,000 and a  temperature of 100 degrees.  CT scan showed a localized perforation of the  distal sigmoid colon, and suprapubic region.  There was extraluminal air.  No fluid.  No evidence of abscess.  The patient's pain has been pretty much  unchanged since this morning.  He denies chills. He is hungry and had his  last meal late last night.   PAST MEDICAL HISTORY:  Significant for mild hypertension, hypothyroidism,  gastroesophageal reflux disease, obstructive sleep apnea and seasonal  asthma.   MEDICATIONS:  1. Cipro 500 mg b.i.d.  2. Synthroid 300 mg once a day.  3. Furosemide 20 mg once a day.  4. Omeprazole 20 mg once a day.  5. Claritin D p.r.n.   PAST SURGICAL HISTORY:  Significant only for joint surgery of the lower  extremities.  He has had no abdominal surgery.   ALLERGIES:  None.   REVIEW OF SYSTEMS:  Significant as above.   PHYSICAL EXAMINATION:  GENERAL:  An age-appropriate, morbidly-obese white  male.  He is alert and oriented and in no  distress.  He is sitting up and  talking with his brother-in-law and wife.  VITAL SIGNS:  His weight is measured today at 452 pounds.  His blood  pressure on admission is 97/40.  It is now currently 127/80.  His heart rate  is 84.  ABDOMEN:  He does complain of 2/10 abdominal pain to my questioning.  Abdomen is morbidly obese, with no hepatosplenomegaly appreciated.  No  abdominal wall hernia defects.  He is tender in the suprapubic region and in  the left lower quadrant.  He does have some moderate voluntary guarding, but  involuntary guarding, and no evidence of peritonitis.  HEENT:  Exam is normocephalic, atraumatic.  NECK:  He has a very thick neck.  Carotids are distally palpable.  LUNGS:  Clear to auscultation and percussion x2.  HEART:  Distant heart sounds, but is regular rate and rhythm  without No  murmurs rubs, or gallops.  EXTREMITIES:  Exam shows no cyanosis, clubbing, edema or deformity.   LABORATORY DATA:  Laboratory values show a white count of 12,000.  BMET is  essentially normal.   CT scan is reviewed and shows some mild-to-moderate extraluminal area in the  mid portion, above the bladder.  No free fluid.  No abscess.   IMPRESSION:  Localized perforated diverticulitis in a morbidly-obese white  male.   PLAN:  After a very long discussion with him and his wife, I explained the  risk of surgical intervention at this time would certainly mean a colostomy  which would have tenuous viability secondary to the width of his abdominal  wall.  I also explained the increased risk of dehiscence and wound infection  secondary to his weight as well as the risk of respiratory dependency  secondary to his obstructive sleep apnea.  After a long discussion with the  patient and his clinical experience which is nontoxic at this time, I think  a trial of IV antibiotics and close observation in the hospital with  possible delayed one stage sigmoid resection is the best option for him.   Certainly with any decline in clinical appearance, increased leukocytosis or  worsening pain with the patient, he would require surgical intervention  including a sigmoid colectomy with colostomy and staged procedure.  He and  his wife understand and wish to proceed in this direction.                                                Vikki Ports, M.D.    KRH/MEDQ  D:  12/23/2003  T:  12/23/2003  Job:  [id number]   cc:   Geoffry Paradise, M.D.  [address]  [place]  [place] [address]  Fax: [phone]

## 2010-10-21 NOTE — Op Note (Signed)
NAME:  Jon Carson, Jon Carson                      ACCOUNT NO.:  [id number]   MEDICAL RECORD NO.:  [id number]                   PATIENT TYPE:  AMB   LOCATION:  DAY                                  FACILITY:  [facility]   PHYSICIAN:  Claudette Laws, M.D.               DATE OF BIRTH:  03/11/1966   DATE OF PROCEDURE:  10/01/2003  DATE OF DISCHARGE:                                 OPERATIVE REPORT   PREOPERATIVE DIAGNOSIS:  Urethral stricture disease, rule out balanitis  xerotica obliterans (BXO).   POSTOPERATIVE DIAGNOSIS:  Urethral stricture disease, rule out balanitis  xerotica obliterans (BXO).   OPERATION/PROCEDURE:  Cystourethroscopy with a direct vision urethrotomy.   SURGEON:  Claudette Laws, M.D.   DESCRIPTION OF PROCEDURE:  The patient was prepped and draped in the dorsal  lithotomy position under intubated general anesthesia.  His meatus  calibrated normally today to a 28-French without difficulty.  There was some  slight whitish discoloration, thinning of the meatus, possibly consistent  with early BXO.  Then using the 17-French cystoscope, urethroscopy was  performed.  He was found to be have a soft stricture in the deep bulbous  urethra.  This patient has a long urethra which is noted throughout.  There  was no definite tiny concentric stricture.  However, I was never able to  negotiate my way past the membranous urethra.  We attempted to pass a  guidewire into the bladder and this proved to be difficult.  Finally I was  able to negotiate a guidewire into the bladder but I was never able to  dilate him with Heyman sounds.  I then went back in with the direct vision  urethrotome and performed a cold cup urethrotomy at the 12 o'clock position  in the deep bulbous urethra.  Finally, this enabled me to negotiate my way  into the bladder with the 17-French cystoscope.  The bladder itself looked  grossly normal, somewhat thickened.  No tumors, no calculi.  Then on the way  out, he  appeared to have a snug urethra throughout.  I was never able to get  the urethrotome deep into the bulbous urethra.  At this point, I elected to  put a catheter in and so I was able to introduce a 16-French coude-tip  Foley, irrigated well.  A B&O suppository was placed per rectum for  anesthetic purposes, and he was taken back to the recovery room in  satisfactory condition.   The plan now is to keep his catheter in for a few days and hopefully this  will dilate the urethra.  I may have him come back to the office and try to  dilate him then in a few days after catheter dilation.  Claudette Laws, M.D.    RFS/MEDQ  D:  10/01/2003  T:  10/01/2003  Job:  [id number]

## 2011-01-04 ENCOUNTER — Other Ambulatory Visit: Payer: Self-pay | Admitting: Dermatology

## 2011-02-24 LAB — BASIC METABOLIC PANEL
BUN: 17
BUN: 15
CO2: 30
CO2: 30
Calcium: 8.6
Calcium: 7.9 — ABNORMAL LOW
Chloride: 104
Chloride: 100
Creatinine, Ser: 0.85
Creatinine, Ser: 0.79
GFR calc Af Amer: >60
GFR calc Af Amer: >60
GFR calc non Af Amer: >60
GFR calc non Af Amer: >60
Glucose, Bld: 100 — ABNORMAL HIGH
Glucose, Bld: 101 — ABNORMAL HIGH
Potassium: 4.1
Potassium: 3.8
Sodium: 138
Sodium: 134 — ABNORMAL LOW

## 2011-02-24 LAB — CBC
HCT: 39.4
HCT: 34.7 — ABNORMAL LOW
Hemoglobin: 13.4
Hemoglobin: 11.7 — ABNORMAL LOW
MCHC: 34.1
MCHC: 33.6
MCV: 81.3
MCV: 82.7
Platelets: 370
Platelets: 325
RBC: 4.84
RBC: 4.2 — ABNORMAL LOW
RDW: 15
RDW: 15
WBC: 7
WBC: 8.8

## 2011-02-24 LAB — DIFFERENTIAL
Basophils Absolute: 0
Basophils Relative: 0
Eosinophils Absolute: 0.1
Eosinophils Relative: 2
Lymphocytes Relative: 36
Lymphs Abs: 2.5
Monocytes Absolute: 0.6
Monocytes Relative: 8
Neutro Abs: 3.8
Neutrophils Relative %: 54

## 2011-03-03 LAB — POCT URINALYSIS DIP (DEVICE)
Bilirubin Urine: NEGATIVE
Glucose, UA: NEGATIVE
Ketones, ur: NEGATIVE
Nitrite: NEGATIVE
Operator id: 247071
Protein, ur: NEGATIVE
Specific Gravity, Urine: 1.02
Urobilinogen, UA: 0.2
pH: 6

## 2011-03-06 ENCOUNTER — Ambulatory Visit (INDEPENDENT_AMBULATORY_CARE_PROVIDER_SITE_OTHER): Payer: Managed Care, Other (non HMO) | Admitting: Nurse Practitioner

## 2011-03-06 ENCOUNTER — Encounter: Payer: Self-pay | Admitting: Nurse Practitioner

## 2011-03-06 ENCOUNTER — Telehealth: Payer: Self-pay | Admitting: Gastroenterology

## 2011-03-06 VITALS — BP 144/76 | HR 80 | Ht 74.0 in | Wt >= 6400 oz

## 2011-03-06 DIAGNOSIS — R109 Unspecified abdominal pain: Secondary | ICD-10-CM

## 2011-03-06 DIAGNOSIS — R1030 Lower abdominal pain, unspecified: Secondary | ICD-10-CM

## 2011-03-06 DIAGNOSIS — Z8719 Personal history of other diseases of the digestive system: Secondary | ICD-10-CM

## 2011-03-06 MED ORDER — AMOXICILLIN-POT CLAVULANATE 875-125 MG PO TABS: 1.0000 | ORAL_TABLET | Freq: Two times a day (BID) | ORAL | Status: AC

## 2011-03-06 NOTE — Telephone Encounter (Signed)
Pt had right lower quadrant abdominal pain over the weekend. Went to urgent care, treated him for diverticulitis with Flagyl and cipro. Pt had trouble keeping the flagyl down, spoke with Dr. Loreta Ave and she told them they needed to be seen today. Pt scheduled to see Willette Cluster NP today at 2pm. Pt aware of appt date and time.

## 2011-03-06 NOTE — Patient Instructions (Signed)
We sent a prescription for the Augmentin to [facility].  Stop the Cipro and Flagyl. We have given you a Low Residue Diet until you have completed the Antibiotics.  Call [place] at [phone] at ext [phone]  In 2 weeks with a condition update.

## 2011-03-08 ENCOUNTER — Encounter: Payer: Self-pay | Admitting: Nurse Practitioner

## 2011-03-08 DIAGNOSIS — R1030 Lower abdominal pain, unspecified: Secondary | ICD-10-CM | POA: Insufficient documentation

## 2011-03-08 DIAGNOSIS — Z8719 Personal history of other diseases of the digestive system: Secondary | ICD-10-CM | POA: Insufficient documentation

## 2011-03-08 NOTE — Assessment & Plan Note (Addendum)
A 3-4 day history of lower abdominal pain, CBC at urgent care facility was normal. Pain improved after starting antibiotics three days ago. He is only taking Cipro as Flagyl caused headaches. This may have been recurrent diverticulitis. His abdominal exam is not concerning. Will switch him to Augmentin for a total of ten days. Recommended a low residue diet until pain has totally resolved. Patient will call us back after completion of antibiotics for a condition update. In the meantime he knows to call for worsening pain and/or fevers.

## 2011-03-08 NOTE — Progress Notes (Signed)
03/08/2011 Jon Carson [id number] 06-05-1966   HISTORY OF PRESENT ILLNESS: Patient is a 45 year old male with a history of a spontaneous perforation of a diverticulum in 2005 for which he was treated medically. Patient had a colonoscopy  Dr. Arlyce Dice in 2008 with findings of left-sided diverticulosis and a hemartomatous polyp.  He is for recall colonoscopy March 2013.  Patient comes in today for evaluation of recurrent lower abdominal pain. Patient awoke Friday morning with lower abdominal pain, predominantly right sided. Pain progressed as the day went on. No urinary symptoms. The following morning the patient was evaluated by an acute care facility where a CBC was completely normal with a white count of 8.2, hemoglobin 13.0. Patient was treated empirically for diverticulitis with Cipro and Flagyl. Flagyl caused terrible headaches so the patient discontinued it. He remains on Cipro. His pain is improving. No other gastrointestinal complaints. Patient has history of GERD, he takes a proton pump inhibitor about every other day.   Past Medical History  Diagnosis Date  . Arthritis   . GERD (gastroesophageal reflux disease)   . Diverticulosis   . Sleep apnea   . Obesities, morbid    Past Surgical History  Procedure Date  . Tonsillectomy   . Urethrotomy     x3  . Hernia repair   . Shoulder arthroscopy   . Stapedes surgery   . Cholecystectomy   . Knee arthroscopy     Bilateral    reports that he has never smoked. He has never used smokeless tobacco. He reports that he does not drink alcohol or use illicit drugs. family history includes Colon cancer in his maternal grandfather and Diabetes in his father. No Known Allergies    Outpatient Encounter Prescriptions as of 03/06/2011  Medication Sig Dispense Refill  . levothyroxine (SYNTHROID, LEVOTHROID) 25 MCG tablet Take 25 mcg by mouth daily.        Marland Kitchen levothyroxine (SYNTHROID, LEVOTHROID) 300 MCG tablet Take 300 mcg by mouth daily.          . meloxicam (MOBIC) 15 MG tablet Take 15 mg by mouth daily.        Marland Kitchen omeprazole (PRILOSEC) 20 MG capsule Take 20 mg by mouth daily.        Marland Kitchen triamterene-hydrochlorothiazide (MAXZIDE-25) 37.5-25 MG per tablet Take 1 tablet by mouth daily.        Marland Kitchen DISCONTD: hyoscyamine (LEVSIN, ANASPAZ) 0.125 MG tablet Take 0.125 mg by mouth every 4 (four) hours as needed.        Marland Kitchen amoxicillin-clavulanate (AUGMENTIN) 875-125 MG per tablet Take 1 tablet by mouth 2 (two) times daily.  20 tablet  0    REVIEW OF SYSTEMS  : Positive for fatigue. All other systems reviewed and negative except where noted in the History of Present Illness.  PHYSICAL EXAM: General: Obese white male in no acute distress Head: Normocephalic and atraumatic Eyes:  sclerae anicteric,conjunctive pink. Ears: Normal auditory acuity Mouth: No deformity or lesions Neck: Supple, no masses.  Lungs: Clear throughout to auscultation Heart: Regular rate and rhythm; no murmurs heard Abdomen: Soft, obese, mild right lower quadrant and mid lower abdominal tenderness.. No masses or hepatomegaly noted. Normal Bowel sounds Rectal: Not done Musculoskeletal: Symmetrical with no gross deformities  Skin: No lesions on visible extremities Extremities: No edema or deformities noted Neurological: Alert oriented x 4, grossly nonfocal Cervical Nodes:  No significant cervical adenopathy Psychological:  Alert and cooperative. Normal mood and affect  ASSESSMENT AND PLAN;

## 2011-03-08 NOTE — Assessment & Plan Note (Signed)
Perforated diverticulum 2005, treated medically. No episodes of diverticulitis since though suspect he has recurrent diverticulitis now.

## 2011-03-09 NOTE — Progress Notes (Signed)
Agree with Ms. Guenther's assessment and plan. Carl E. Gessner, MD, FACG   

## 2011-03-17 LAB — DIFFERENTIAL
Basophils Absolute: 0
Basophils Absolute: 0
Basophils Absolute: 0
Basophils Relative: 0
Basophils Relative: 0
Basophils Relative: 1
Eosinophils Absolute: 0.3
Eosinophils Absolute: 0.1
Eosinophils Absolute: 0.4
Eosinophils Relative: 4
Eosinophils Relative: 1
Eosinophils Relative: 3
Lymphocytes Relative: 32
Lymphocytes Relative: 30
Lymphocytes Relative: 37
Lymphs Abs: 2.5
Lymphs Abs: 2.2
Lymphs Abs: 3.5 — ABNORMAL HIGH
Monocytes Absolute: 0.6
Monocytes Absolute: 0.5
Monocytes Absolute: 0.8 — ABNORMAL HIGH
Monocytes Relative: 8
Monocytes Relative: 7
Monocytes Relative: 8
Neutro Abs: 4.3
Neutro Abs: 3.3
Neutro Abs: 7
Neutrophils Relative %: 56
Neutrophils Relative %: 54
Neutrophils Relative %: 60

## 2011-03-17 LAB — BASIC METABOLIC PANEL
BUN: 13
BUN: 15
CO2: 30
CO2: 30
Calcium: 8.2 — ABNORMAL LOW
Calcium: 8.3 — ABNORMAL LOW
Chloride: 103
Chloride: 104
Creatinine, Ser: 0.87
Creatinine, Ser: 0.94
GFR calc Af Amer: >60
GFR calc Af Amer: >60
GFR calc non Af Amer: >60
GFR calc non Af Amer: >60
Glucose, Bld: 109 — ABNORMAL HIGH
Glucose, Bld: 89
Potassium: 3.9
Potassium: 4
Sodium: 139
Sodium: 139

## 2011-03-17 LAB — HEPATIC FUNCTION PANEL
ALT: 32
AST: 22
Albumin: 3 — ABNORMAL LOW
Alkaline Phosphatase: 73
Bilirubin, Direct: 0.1
Indirect Bilirubin: 0.7
Total Bilirubin: 0.8
Total Protein: 6.4

## 2011-03-17 LAB — COMPREHENSIVE METABOLIC PANEL
ALT: 19
ALT: 21
AST: 20
AST: 22
Albumin: 3.3 — ABNORMAL LOW
Albumin: 3.4 — ABNORMAL LOW
Alkaline Phosphatase: 67
Alkaline Phosphatase: 77
BUN: 16
BUN: 16
CO2: 29
CO2: 30
Calcium: 8.6
Calcium: 8.7
Chloride: 104
Chloride: 105
Creatinine, Ser: 0.85
Creatinine, Ser: 0.98
GFR calc Af Amer: >60
GFR calc Af Amer: >60
GFR calc non Af Amer: >60
GFR calc non Af Amer: >60
Glucose, Bld: 102 — ABNORMAL HIGH
Glucose, Bld: 105 — ABNORMAL HIGH
Potassium: 4.1
Potassium: 4.2
Sodium: 141
Sodium: 142
Total Bilirubin: 0.7
Total Bilirubin: 0.8
Total Protein: 6.8
Total Protein: 7.1

## 2011-03-17 LAB — CBC
HCT: 33.4 — ABNORMAL LOW
HCT: 34 — ABNORMAL LOW
HCT: 35.3 — ABNORMAL LOW
HCT: 36.8 — ABNORMAL LOW
Hemoglobin: 11.1 — ABNORMAL LOW
Hemoglobin: 11.3 — ABNORMAL LOW
Hemoglobin: 11.9 — ABNORMAL LOW
Hemoglobin: 12.2 — ABNORMAL LOW
MCHC: 33.3
MCHC: 33.4
MCHC: 33.2
MCHC: 33.8
MCV: 80.9
MCV: 82.1
MCV: 81.8
MCV: 82.4
Platelets: 391
Platelets: 435 — ABNORMAL HIGH
Platelets: 324
Platelets: 473 — ABNORMAL HIGH
RBC: 4.07 — ABNORMAL LOW
RBC: 4.2 — ABNORMAL LOW
RBC: 4.28
RBC: 4.49
RDW: 15 — ABNORMAL HIGH
RDW: 15.4 — ABNORMAL HIGH
RDW: 15 — ABNORMAL HIGH
RDW: 15.4 — ABNORMAL HIGH
WBC: 6.7
WBC: 7.7
WBC: 11.7 — ABNORMAL HIGH
WBC: 6.1

## 2011-03-17 LAB — URINALYSIS, ROUTINE W REFLEX MICROSCOPIC
Glucose, UA: NEGATIVE
Hgb urine dipstick: NEGATIVE
Ketones, ur: NEGATIVE
Nitrite: NEGATIVE
Protein, ur: NEGATIVE
Specific Gravity, Urine: 1.019
Urobilinogen, UA: 0.2
pH: 6.5

## 2011-03-17 LAB — TSH: TSH: 4.762

## 2011-03-17 LAB — LIPASE, BLOOD
Lipase: 19
Lipase: 22

## 2011-03-22 LAB — CBC
HCT: 36.2 — ABNORMAL LOW
Hemoglobin: 12 — ABNORMAL LOW
MCHC: 33.1
MCV: 81.7
Platelets: 318
RBC: 4.43
RDW: 15.1 — ABNORMAL HIGH
WBC: 6.8

## 2011-03-22 LAB — COMPREHENSIVE METABOLIC PANEL
ALT: 20
AST: 18
Albumin: 3.3 — ABNORMAL LOW
Alkaline Phosphatase: 62
BUN: 13
CO2: 25
Calcium: 8.5
Chloride: 105
Creatinine, Ser: 0.8
GFR calc Af Amer: >60
GFR calc non Af Amer: >60
Glucose, Bld: 89
Potassium: 3.9
Sodium: 138
Total Bilirubin: 0.9
Total Protein: 6.3

## 2011-03-22 LAB — DIFFERENTIAL
Basophils Absolute: 0
Basophils Relative: 0
Eosinophils Absolute: 0.1
Eosinophils Relative: 1
Lymphocytes Relative: 33
Lymphs Abs: 2.2
Monocytes Absolute: 0.5
Monocytes Relative: 7
Neutro Abs: 4
Neutrophils Relative %: 58

## 2011-03-22 LAB — URINALYSIS, ROUTINE W REFLEX MICROSCOPIC
Bilirubin Urine: NEGATIVE
Glucose, UA: NEGATIVE
Hgb urine dipstick: NEGATIVE
Ketones, ur: NEGATIVE
Nitrite: NEGATIVE
Protein, ur: NEGATIVE
Specific Gravity, Urine: 1.017
Urobilinogen, UA: 0.2
pH: 7

## 2011-03-22 LAB — LIPASE, BLOOD: Lipase: 23

## 2011-07-03 ENCOUNTER — Encounter: Payer: Self-pay | Admitting: Gastroenterology

## 2011-08-19 ENCOUNTER — Emergency Department (HOSPITAL_COMMUNITY)
Admission: EM | Admit: 2011-08-19 | Discharge: 2011-08-19 | Disposition: A | Discharge status: Home / Self Care | Payer: Managed Care, Other (non HMO) | Attending: Emergency Medicine | Admitting: Emergency Medicine

## 2011-08-19 ENCOUNTER — Emergency Department (HOSPITAL_COMMUNITY): Payer: Managed Care, Other (non HMO)

## 2011-08-19 ENCOUNTER — Encounter (HOSPITAL_COMMUNITY): Payer: Self-pay | Admitting: Adult Health

## 2011-08-19 DIAGNOSIS — R609 Edema, unspecified: Secondary | ICD-10-CM | POA: Insufficient documentation

## 2011-08-19 DIAGNOSIS — E875 Hyperkalemia: Secondary | ICD-10-CM | POA: Insufficient documentation

## 2011-08-19 DIAGNOSIS — R109 Unspecified abdominal pain: Secondary | ICD-10-CM | POA: Insufficient documentation

## 2011-08-19 DIAGNOSIS — N201 Calculus of ureter: Secondary | ICD-10-CM

## 2011-08-19 HISTORY — DX: Disorder of kidney and ureter, unspecified: N28.9

## 2011-08-19 LAB — URINALYSIS, ROUTINE W REFLEX MICROSCOPIC
Bilirubin Urine: NEGATIVE
Glucose, UA: NEGATIVE mg/dL
Ketones, ur: NEGATIVE mg/dL
Nitrite: NEGATIVE
Protein, ur: NEGATIVE mg/dL
Specific Gravity, Urine: 1.021 (ref 1.005–1.030)
Urobilinogen, UA: 1 mg/dL (ref 0.0–1.0)
pH: 7.5 (ref 5.0–8.0)

## 2011-08-19 LAB — BASIC METABOLIC PANEL
BUN: 16 mg/dL (ref 6–23)
CO2: 27 mEq/L (ref 19–32)
Calcium: 8.6 mg/dL (ref 8.4–10.5)
Chloride: 98 mEq/L (ref 96–112)
Creatinine, Ser: 0.88 mg/dL (ref 0.50–1.35)
GFR calc Af Amer: >90 mL/min (ref >90)
GFR calc non Af Amer: >90 mL/min (ref >90)
Glucose, Bld: 116 mg/dL — ABNORMAL HIGH (ref 70–99)
Potassium: 5.4 mEq/L — ABNORMAL HIGH (ref 3.5–5.1)
Sodium: 136 mEq/L (ref 135–145)

## 2011-08-19 LAB — POTASSIUM: Potassium: 5.3 mEq/L — ABNORMAL HIGH (ref 3.5–5.1)

## 2011-08-19 LAB — CBC
HCT: 42.6 % (ref 39.0–52.0)
Hemoglobin: 14.5 g/dL (ref 13.0–17.0)
MCH: 30.5 pg (ref 26.0–34.0)
MCHC: 34 g/dL (ref 30.0–36.0)
MCV: 89.5 fL (ref 78.0–100.0)
Platelets: 284 10*3/uL (ref 150–400)
RBC: 4.76 MIL/uL (ref 4.22–5.81)
RDW: 13.5 % (ref 11.5–15.5)
WBC: 11.3 10*3/uL — ABNORMAL HIGH (ref 4.0–10.5)

## 2011-08-19 LAB — URINE MICROSCOPIC-ADD ON

## 2011-08-19 MED ORDER — HYDROMORPHONE HCL PF 1 MG/ML IJ SOLN
1.0000 mg | Freq: Once | INTRAMUSCULAR | Status: AC
  Administered 2011-08-19: 1 mg via INTRAVENOUS
  Filled 2011-08-19: qty 1

## 2011-08-19 MED ORDER — OXYCODONE-ACETAMINOPHEN 5-325 MG PO TABS: 1.0000 | ORAL_TABLET | Freq: Four times a day (QID) | ORAL | Status: DC | PRN

## 2011-08-19 MED ORDER — ONDANSETRON HCL 4 MG PO TABS: 4.0000 mg | ORAL_TABLET | Freq: Four times a day (QID) | ORAL | Status: AC | PRN

## 2011-08-19 MED ORDER — ONDANSETRON HCL 4 MG/2ML IJ SOLN
4.0000 mg | Freq: Once | INTRAMUSCULAR | Status: AC
  Administered 2011-08-19: 4 mg via INTRAVENOUS
  Filled 2011-08-19: qty 2

## 2011-08-19 MED ORDER — HYDROCHLOROTHIAZIDE 25 MG PO TABS: 25.0000 mg | ORAL_TABLET | Freq: Every day | ORAL | Status: DC

## 2011-08-19 MED ORDER — TAMSULOSIN HCL 0.4 MG PO CAPS: 0.4000 mg | ORAL_CAPSULE | Freq: Every day | ORAL | Status: DC

## 2011-08-19 MED ORDER — IBUPROFEN 800 MG PO TABS: 800.0000 mg | ORAL_TABLET | Freq: Three times a day (TID) | ORAL | Status: DC

## 2011-08-19 MED ORDER — SODIUM CHLORIDE 0.9 % IV SOLN
Freq: Once | INTRAVENOUS | Status: AC
  Administered 2011-08-19: 14:00:00 via INTRAVENOUS

## 2011-08-19 MED ORDER — KETOROLAC TROMETHAMINE 30 MG/ML IJ SOLN
30.0000 mg | Freq: Once | INTRAMUSCULAR | Status: AC
  Administered 2011-08-19: 30 mg via INTRAVENOUS
  Filled 2011-08-19: qty 1

## 2011-08-19 MED ORDER — ONDANSETRON HCL 4 MG PO TABS: 4.0000 mg | ORAL_TABLET | Freq: Four times a day (QID) | ORAL | Status: DC | PRN

## 2011-08-19 NOTE — ED Provider Notes (Signed)
History     CSN: [id number]  Arrival date & time 08/19/11  1218   First MD Initiated Contact with Patient 08/19/11 1229      Chief Complaint  Patient presents with  . Flank Pain    (Consider location/radiation/quality/duration/timing/severity/associated sxs/prior treatment) The history is provided by the patient and the spouse.  46 y/o M with hx cyst on the left kidney presents to ED with c/c of 3 hours of left flank pain. Sudden onset, waxing and waning though discomfort is constant, 10/10 at its worst, intermittently throbbing and shooting, radiates to the left lower abdomen. There has been assoc nausea. Pt denies fever, chills, CP, SOB, vomiting, dysuria, hematuria, difficulty urinating, or testicular pain. Pt does note less urination than typical for him this morning in response to taking his diuretic. Hx kidney stones several years ago, reports today's pain is worse than that episode. Nothing makes the pain better or worse. Took naproxen for the pain as he thought it may be a muscle spasm but there was no improvement in his symptoms.  Past Medical History  Diagnosis Date  . Arthritis   . GERD (gastroesophageal reflux disease)   . Diverticulosis   . Sleep apnea   . Obesities, morbid   . Renal disorder     Past Surgical History  Procedure Date  . Tonsillectomy   . Urethrotomy     x3  . Hernia repair   . Shoulder arthroscopy   . Stapedes surgery   . Cholecystectomy   . Knee arthroscopy     Bilateral    Family History  Problem Relation Age of Onset  . Colon cancer Maternal Grandfather   . Diabetes Father     History  Substance Use Topics  . Smoking status: Never Smoker   . Smokeless tobacco: Never Used  . Alcohol Use: No      Review of Systems 10 systems reviewed and are negative for acute change except as noted in the HPI.  Allergies  Raspberry  Home Medications   Current Outpatient Rx  Name Route Sig Dispense Refill  . LEVOTHYROXINE SODIUM 25 MCG  PO TABS Oral Take 25 mcg by mouth See admin instructions. Takes daily with 300mg  synthroid    . LEVOTHYROXINE SODIUM 300 MCG PO TABS Oral Take 300 mcg by mouth See admin instructions. Takes daily with synthroid    . MELOXICAM 15 MG PO TABS Oral Take 15 mg by mouth daily.      . ADULT MULTIVITAMIN W/MINERALS CH Oral Take 1 tablet by mouth daily.    Marland Kitchen NAPROXEN SODIUM 220 MG PO TABS Oral Take 440 mg by mouth as needed. pain    . OMEPRAZOLE 20 MG PO CPDR Oral Take 20 mg by mouth daily.      . TRIAMTERENE-HCTZ 37.5-25 MG PO TABS Oral Take 1 tablet by mouth daily.        BP 169/94  Pulse 68  Temp 97.6 F (36.4 C)  Resp 16  SpO2 98%  Physical Exam  Constitutional: He is oriented to person, place, and time.       Morbidly obese, well-developed, uncomfortable appearing. VS reviewed and are sig for HTN.  HENT:  Head: Normocephalic and atraumatic.  Right Ear: External ear normal.  Left Ear: External ear normal.       Mucous membranes moist  Eyes: Conjunctivae are normal. Pupils are equal, round, and reactive to light.  Neck: Normal range of motion. Neck supple.  Cardiovascular: Normal rate, regular  rhythm, normal heart sounds and intact distal pulses.   Pulmonary/Chest: Effort normal and breath sounds normal. No respiratory distress. He exhibits no tenderness.  Abdominal: Soft. Bowel sounds are normal. He exhibits no distension. There is no tenderness. There is no rebound and no guarding.       Examination limited by body habitus  Genitourinary:       No CVA tenderness  Musculoskeletal: He exhibits no tenderness. Edema: 1+ non-pitting edema to BLE.  Neurological: He is alert and oriented to person, place, and time.  Skin: Skin is warm and dry.  Psychiatric: He has a normal mood and affect.    ED Course  Procedures (including critical care time)  Labs Reviewed  URINALYSIS, ROUTINE W REFLEX MICROSCOPIC - Abnormal; Notable for the following:    Hgb urine dipstick SMALL (*)     Leukocytes, UA SMALL (*)    All other components within normal limits  BASIC METABOLIC PANEL - Abnormal; Notable for the following:    Potassium 5.4 (*) MODERATE HEMOLYSIS   Glucose, Bld 116 (*)    All other components within normal limits  CBC - Abnormal; Notable for the following:    WBC 11.3 (*)    All other components within normal limits  POTASSIUM - Abnormal; Notable for the following:    Potassium 5.3 (*) MODERATE HEMOLYSIS   All other components within normal limits  URINE MICROSCOPIC-ADD ON   Ct Abdomen Pelvis Wo Contrast  08/19/2011  *RADIOLOGY REPORT*  Clinical Data: Left flank pain  CT ABDOMEN AND PELVIS WITHOUT CONTRAST  Technique:  Multidetector CT imaging of the abdomen and pelvis was performed following the standard protocol without intravenous contrast.  Comparison: 06/28/2009  Findings: Lung bases are unremarkable.  Study is markedly limited by streak artifacts from the patient's large body habitus.  Unenhanced liver is unremarkable. The patient is status post cholecystectomy.  The unenhanced pancreas, spleen and adrenal glands are unremarkable.  There is mild left hydronephrosis and left hydroureter.  There is a nonobstructive calcified calculus in the lower pole of the left kidney measures 4 mm.  In axial image 43 there is a calcified calculus in the proximal left ureter measures 6 mm.  This is best visualized in the sagittal image 58 at the level of the S3 vertebral body.  Mild left perinephric stranding.  Again noted a poorly visualized lesion in the lower pole of the left kidney measures about 5.3 cm.  This cannot be characterized without IV contrast level is stable in size from prior exam.  No distal ureteral calcifications are noted bilaterally.  No small bowel obstruction.  No ascites or free air.  No adenopathy.  There is no pericecal inflammation.  Normal appendix is partially visualized axial image 51.  The urinary bladder is under distended grossly unremarkable.  Prostate  gland and seminal vesicles are unremarkable.  Sagittal images of the spine shows disc space flattening at L5 S1 level.  There is posterior spurring with mild spinal canal stenosis at T11-T12 and T12-L1 level.  IMPRESSION:  1.  There is mild left hydronephrosis and proximal left hydroureter. 2.  6 mm obstructive calculus noted in the proximal left ureter at the level of the L3 vertebral body. 3.  There is a 4 mm nonobstructive calculus in lower pole of the left kidney. 4.  Again noted poorly visualized lesion in the lower pole of the left kidney measures 5.3 cm.  This cannot be characterized without IV contrast however the lesion is stable  in size from prior exam.  5.  Normal appendix.  Original Report Authenticated By: Natasha Mead, M.D.     1. Ureteral calculus, left   2. Hyperkalemia       MDM  12:30 PM Patient seen and evaluated. Initial history physical examination complete. Story is concerning for a kidney stone. As the patient has a history of 3 CT scans of the abdomen and pelvis the last 5 years, we will hold off on ordering any imaging and review his urinalysis for hematuria and if positive, will advise followup with his urologist, Dr. Laverle Patter.    4:00 PM Urinalysis without convincing hematuria for a kidney stone. CT of the abdomen and pelvis is ordered. CBC and basic metabolic panel are also ordered.    7:00 PM CT scans been reviewed and demonstrates a proximal left ureteral stone with mild hydronephrosis and hydroureter. Size of what is likely his renal cyst is stable from prior examination. Although initial dose of Dilaudid was helpful in relieving his pain, second dose has not been as helpful and Toradol as been ordered in an attempt to relieve his pain. Will determine appropriate disposition course based on his response to Toradol.      8:30 PM Pt has just received his toradol. EDP Steinl has received report and will follow-up on pt pain with hope to discharge home if  controlled.  Shaaron Adler, PA-C 08/19/11 2030

## 2011-08-19 NOTE — ED Notes (Addendum)
C/o 10/10 left flank pain that radiates around to front and is described as throbbing and constant began at 10am. Denies hematuria and dysuria.  Hx of cyst on left kidney.

## 2011-08-19 NOTE — ED Provider Notes (Signed)
Medical screening examination/treatment/procedure(s) were conducted as a shared visit with non-physician practitioner(s) and myself.  I personally evaluated the patient during the encounter Pt with acute onset left flank pain. Hx kidney stone. prox ureteral stone on ct. Pt currently pain free, requests d/c. Sees dr Laverle Patter. Will f/u there are kidney stone and renal cyst/mass.   Suzi Roots, MD 08/19/11 [phone]

## 2011-08-19 NOTE — Discharge Instructions (Signed)
As discussed, there is a 6mm stone in your left ureter, which is the tube that connects your kidney to the bladder. Most people can pass kidney stones this size on their own, but you should follow-up closely with the urologist in the next couple days (call office Monday morning to arrange appointment) and strain your urine in the meantime.  Take motrin as need for pain. You may also take percocet as need for pain. No driving for the next 6 hours or when taking percocet. Also, do not take tylenol or acetaminophen containing medication when taking percocet. Take flomax as prescribed. Take zofran as need for nausea. Your ct also makes note of a 5.3 cm left kidney lower pole cyst/mass - discuss with Dr Laverle Patter at follow up this week as well.    Return to the ER if your pain cannot be controlled with the at-home medications, or if fever, persistent vomiting, other concern.  Also, your potassium was slightly high at 5.3. This is likely due to your diuretic that causes some retention of potassium. Your diuretic is being temporarily changed to HCTZ and you should see your regular doctor for a recheck of your potassium this week, and further recommendations regarding your long-term medication regimen.          Kidney Stones Kidney stones (ureteral lithiasis) are deposits that form inside your kidneys. The intense pain is caused by the stone moving through the urinary tract. When the stone moves, the ureter goes into spasm around the stone. The stone is usually passed in the urine.  CAUSES   A disorder that makes certain neck glands produce too much parathyroid hormone (primary hyperparathyroidism).   A buildup of uric acid crystals.   Narrowing (stricture) of the ureter.   A kidney obstruction present at birth (congenital obstruction).   Previous surgery on the kidney or ureters.   Numerous kidney infections.  SYMPTOMS   Feeling sick to your stomach (nauseous).   Throwing up (vomiting).    Blood in the urine (hematuria).   Pain that usually spreads (radiates) to the groin.   Frequency or urgency of urination.  DIAGNOSIS   Taking a history and physical exam.   Blood or urine tests.   Computerized X-ray scan (CT scan).   Occasionally, an examination of the inside of the urinary bladder (cystoscopy) is performed.  TREATMENT   Observation.   Increasing your fluid intake.   Surgery may be needed if you have severe pain or persistent obstruction.  The size, location, and chemical composition are all important variables that will determine the proper choice of action for you. Talk to your caregiver to better understand your situation so that you will minimize the risk of injury to yourself and your kidney.  HOME CARE INSTRUCTIONS   Drink enough water and fluids to keep your urine clear or pale yellow.   Strain all urine through the provided strainer. Keep all particulate matter and stones for your caregiver to see. The stone causing the pain may be as small as a grain of salt. It is very important to use the strainer each and every time you pass your urine. The collection of your stone will allow your caregiver to analyze it and verify that a stone has actually passed.   Only take over-the-counter or prescription medicines for pain, discomfort, or fever as directed by your caregiver.   Make a follow-up appointment with your caregiver as directed.   Get follow-up X-rays if required. The absence of pain  does not always mean that the stone has passed. It may have only stopped moving. If the urine remains completely obstructed, it can cause loss of kidney function or even complete destruction of the kidney. It is your responsibility to make sure X-rays and follow-ups are completed. Ultrasounds of the kidney can show blockages and the status of the kidney. Ultrasounds are not associated with any radiation and can be performed easily in a matter of minutes.  SEEK IMMEDIATE  MEDICAL CARE IF:   Pain cannot be controlled with the prescribed medicine.   You have a fever.   The severity or intensity of pain increases over 18 hours and is not relieved by pain medicine.   You develop a new onset of abdominal pain.   You feel faint or pass out.  MAKE SURE YOU:   Understand these instructions.   Will watch your condition.   Will get help right away if you are not doing well or get worse.  Document Released: 05/22/2005 Document Revised: 05/11/2011 Document Reviewed: 09/17/2009 [facility] Patient Information 2012 [place], [place].

## 2011-08-20 NOTE — ED Provider Notes (Signed)
Medical screening examination/treatment/procedure(s) were performed by non-physician practitioner and as supervising physician I was immediately available for consultation/collaboration.   Celene Kras, MD 08/20/11 [phone]

## 2011-08-22 ENCOUNTER — Other Ambulatory Visit: Payer: Self-pay | Admitting: Urology

## 2011-08-23 ENCOUNTER — Encounter (HOSPITAL_COMMUNITY): Payer: Self-pay

## 2011-08-23 ENCOUNTER — Ambulatory Visit (HOSPITAL_COMMUNITY)
Admission: RE | Admit: 2011-08-23 | Discharge: 2011-08-23 | Disposition: A | Discharge status: Home / Self Care | Payer: Managed Care, Other (non HMO) | Source: Ambulatory Visit | Attending: Urology | Admitting: Urology

## 2011-08-23 ENCOUNTER — Ambulatory Visit: Admit: 2011-08-23 | Payer: Self-pay | Admitting: Urology

## 2011-08-23 ENCOUNTER — Ambulatory Visit (HOSPITAL_COMMUNITY): Payer: Managed Care, Other (non HMO) | Admitting: Anesthesiology

## 2011-08-23 ENCOUNTER — Inpatient Hospital Stay (HOSPITAL_COMMUNITY)
Admission: AD | Admit: 2011-08-23 | Discharge: 2011-08-26 | DRG: 694 | Disposition: A | Discharge status: Home / Self Care | Payer: Managed Care, Other (non HMO) | Source: Ambulatory Visit | Attending: Urology | Admitting: Urology

## 2011-08-23 ENCOUNTER — Encounter (HOSPITAL_COMMUNITY): Payer: Self-pay | Admitting: *Deleted

## 2011-08-23 ENCOUNTER — Encounter (HOSPITAL_COMMUNITY)
Admission: RE | Admit: 2011-08-23 | Discharge: 2011-08-23 | Disposition: A | Discharge status: Home / Self Care | Payer: Managed Care, Other (non HMO) | Source: Ambulatory Visit | Attending: Urology | Admitting: Urology

## 2011-08-23 ENCOUNTER — Encounter (HOSPITAL_COMMUNITY)
Admission: AD | Disposition: A | Discharge status: Home / Self Care | Payer: Self-pay | Source: Ambulatory Visit | Attending: Urology

## 2011-08-23 ENCOUNTER — Ambulatory Visit (HOSPITAL_COMMUNITY): Payer: Managed Care, Other (non HMO)

## 2011-08-23 ENCOUNTER — Other Ambulatory Visit: Payer: Self-pay | Admitting: Urology

## 2011-08-23 ENCOUNTER — Encounter (HOSPITAL_COMMUNITY): Payer: Self-pay | Admitting: Pharmacy Technician

## 2011-08-23 ENCOUNTER — Encounter (HOSPITAL_COMMUNITY): Payer: Self-pay | Admitting: Anesthesiology

## 2011-08-23 ENCOUNTER — Other Ambulatory Visit: Payer: Self-pay

## 2011-08-23 DIAGNOSIS — R509 Fever, unspecified: Secondary | ICD-10-CM | POA: Diagnosis present

## 2011-08-23 DIAGNOSIS — E039 Hypothyroidism, unspecified: Secondary | ICD-10-CM | POA: Diagnosis present

## 2011-08-23 DIAGNOSIS — Z6841 Body Mass Index (BMI) 40.0 and over, adult: Secondary | ICD-10-CM

## 2011-08-23 DIAGNOSIS — N35919 Unspecified urethral stricture, male, unspecified site: Secondary | ICD-10-CM | POA: Diagnosis present

## 2011-08-23 DIAGNOSIS — N201 Calculus of ureter: Principal | ICD-10-CM | POA: Diagnosis present

## 2011-08-23 DIAGNOSIS — E876 Hypokalemia: Secondary | ICD-10-CM | POA: Diagnosis not present

## 2011-08-23 DIAGNOSIS — N200 Calculus of kidney: Secondary | ICD-10-CM | POA: Diagnosis present

## 2011-08-23 DIAGNOSIS — N12 Tubulo-interstitial nephritis, not specified as acute or chronic: Secondary | ICD-10-CM | POA: Diagnosis present

## 2011-08-23 DIAGNOSIS — Z01812 Encounter for preprocedural laboratory examination: Secondary | ICD-10-CM

## 2011-08-23 HISTORY — DX: Essential (primary) hypertension: I10

## 2011-08-23 HISTORY — DX: Hyperkalemia: E87.5

## 2011-08-23 HISTORY — DX: Shortness of breath: R06.02

## 2011-08-23 HISTORY — DX: Jaw pain: R68.84

## 2011-08-23 HISTORY — DX: Hypothyroidism, unspecified: E03.9

## 2011-08-23 HISTORY — PX: CYSTOSCOPY W/ URETERAL STENT PLACEMENT: SHX1429

## 2011-08-23 HISTORY — DX: Localized edema: R60.0

## 2011-08-23 LAB — SURGICAL PCR SCREEN
MRSA, PCR: POSITIVE — AB
Staphylococcus aureus: POSITIVE — AB

## 2011-08-23 LAB — CBC
HCT: 36.8 % — ABNORMAL LOW (ref 39.0–52.0)
HCT: 34.1 % — ABNORMAL LOW (ref 39.0–52.0)
Hemoglobin: 12.2 g/dL — ABNORMAL LOW (ref 13.0–17.0)
Hemoglobin: 11.5 g/dL — ABNORMAL LOW (ref 13.0–17.0)
MCH: 29.3 pg (ref 26.0–34.0)
MCH: 29.8 pg (ref 26.0–34.0)
MCHC: 33.2 g/dL (ref 30.0–36.0)
MCHC: 33.7 g/dL (ref 30.0–36.0)
MCV: 88.5 fL (ref 78.0–100.0)
MCV: 88.3 fL (ref 78.0–100.0)
Platelets: 211 10*3/uL (ref 150–400)
Platelets: 204 10*3/uL (ref 150–400)
RBC: 4.16 MIL/uL — ABNORMAL LOW (ref 4.22–5.81)
RBC: 3.86 MIL/uL — ABNORMAL LOW (ref 4.22–5.81)
RDW: 13.7 % (ref 11.5–15.5)
RDW: 13.8 % (ref 11.5–15.5)
WBC: 15.6 10*3/uL — ABNORMAL HIGH (ref 4.0–10.5)
WBC: 14.4 10*3/uL — ABNORMAL HIGH (ref 4.0–10.5)

## 2011-08-23 LAB — DIFFERENTIAL
Basophils Absolute: 0 10*3/uL (ref 0.0–0.1)
Basophils Relative: 0 % (ref 0–1)
Eosinophils Absolute: 0 10*3/uL (ref 0.0–0.7)
Eosinophils Relative: 0 % (ref 0–5)
Lymphocytes Relative: 13 % (ref 12–46)
Lymphs Abs: 1.8 10*3/uL (ref 0.7–4.0)
Monocytes Absolute: 2.2 10*3/uL — ABNORMAL HIGH (ref 0.1–1.0)
Monocytes Relative: 15 % — ABNORMAL HIGH (ref 3–12)
Neutro Abs: 10.3 10*3/uL — ABNORMAL HIGH (ref 1.7–7.7)
Neutrophils Relative %: 72 % (ref 43–77)

## 2011-08-23 LAB — BASIC METABOLIC PANEL
BUN: 13 mg/dL (ref 6–23)
BUN: 13 mg/dL (ref 6–23)
CO2: 29 mEq/L (ref 19–32)
CO2: 30 mEq/L (ref 19–32)
Calcium: 8.5 mg/dL (ref 8.4–10.5)
Calcium: 8.3 mg/dL — ABNORMAL LOW (ref 8.4–10.5)
Chloride: 90 mEq/L — ABNORMAL LOW (ref 96–112)
Chloride: 92 mEq/L — ABNORMAL LOW (ref 96–112)
Creatinine, Ser: 1.73 mg/dL — ABNORMAL HIGH (ref 0.50–1.35)
Creatinine, Ser: 1.67 mg/dL — ABNORMAL HIGH (ref 0.50–1.35)
GFR calc Af Amer: 53 mL/min — ABNORMAL LOW (ref >90)
GFR calc Af Amer: 56 mL/min — ABNORMAL LOW (ref >90)
GFR calc non Af Amer: 46 mL/min — ABNORMAL LOW (ref >90)
GFR calc non Af Amer: 48 mL/min — ABNORMAL LOW (ref >90)
Glucose, Bld: 107 mg/dL — ABNORMAL HIGH (ref 70–99)
Glucose, Bld: 106 mg/dL — ABNORMAL HIGH (ref 70–99)
Potassium: 3 mEq/L — ABNORMAL LOW (ref 3.5–5.1)
Potassium: 2.5 mEq/L — CL (ref 3.5–5.1)
Sodium: 131 mEq/L — ABNORMAL LOW (ref 135–145)
Sodium: 132 mEq/L — ABNORMAL LOW (ref 135–145)

## 2011-08-23 SURGERY — CYSTOSCOPY, WITH RETROGRADE PYELOGRAM AND URETERAL STENT INSERTION
Anesthesia: General | Site: Ureter | Laterality: Left | Wound class: Contaminated

## 2011-08-23 MED ORDER — FENTANYL CITRATE 0.05 MG/ML IJ SOLN: 25.0000 ug | INTRAMUSCULAR | Status: DC | PRN

## 2011-08-23 MED ORDER — LACTATED RINGERS IV SOLN: INTRAVENOUS | Status: DC

## 2011-08-23 MED ORDER — MORPHINE SULFATE 2 MG/ML IJ SOLN
2.0000 mg | INTRAMUSCULAR | Status: DC | PRN
  Administered 2011-08-24: 4 mg via INTRAVENOUS
  Administered 2011-08-24: 2 mg via INTRAVENOUS
  Filled 2011-08-23 (×2): qty 2

## 2011-08-23 MED ORDER — PROPOFOL 10 MG/ML IV BOLUS
INTRAVENOUS | Status: DC | PRN
  Administered 2011-08-23: 200 mg via INTRAVENOUS

## 2011-08-23 MED ORDER — DIPHENHYDRAMINE HCL 12.5 MG/5ML PO ELIX
12.5000 mg | ORAL_SOLUTION | Freq: Four times a day (QID) | ORAL | Status: DC | PRN
  Administered 2011-08-24 – 2011-08-25 (×3): 12.5 mg via ORAL
  Filled 2011-08-23 (×3): qty 5

## 2011-08-23 MED ORDER — CIPROFLOXACIN IN D5W 400 MG/200ML IV SOLN
400.0000 mg | INTRAVENOUS | Status: AC
  Administered 2011-08-23: 400 mg via INTRAVENOUS

## 2011-08-23 MED ORDER — STERILE WATER FOR IRRIGATION IR SOLN
Status: DC | PRN
  Administered 2011-08-23: 1500 mL via INTRAVESICAL

## 2011-08-23 MED ORDER — MIDAZOLAM HCL 5 MG/5ML IJ SOLN
INTRAMUSCULAR | Status: DC | PRN
  Administered 2011-08-23 (×2): 1 mg via INTRAVENOUS

## 2011-08-23 MED ORDER — ACETAMINOPHEN 10 MG/ML IV SOLN
INTRAVENOUS | Status: DC | PRN
  Administered 2011-08-23: 1000 mg via INTRAVENOUS

## 2011-08-23 MED ORDER — HYDROCODONE-ACETAMINOPHEN 5-325 MG PO TABS
1.0000 | ORAL_TABLET | ORAL | Status: DC | PRN
  Administered 2011-08-24 – 2011-08-25 (×2): 2 via ORAL
  Filled 2011-08-23 (×2): qty 2

## 2011-08-23 MED ORDER — DEXTROSE 5 % IV SOLN
1.0000 g | INTRAVENOUS | Status: DC
  Administered 2011-08-23 – 2011-08-24 (×2): 1 g via INTRAVENOUS
  Filled 2011-08-23 (×5): qty 10

## 2011-08-23 MED ORDER — IOHEXOL 300 MG/ML  SOLN
INTRAMUSCULAR | Status: DC | PRN
  Administered 2011-08-23: 10 mL

## 2011-08-23 MED ORDER — HEPARIN SODIUM (PORCINE) 5000 UNIT/ML IJ SOLN
5000.0000 [IU] | Freq: Three times a day (TID) | INTRAMUSCULAR | Status: DC
  Administered 2011-08-23 – 2011-08-26 (×8): 5000 [IU] via SUBCUTANEOUS
  Filled 2011-08-23 (×11): qty 1

## 2011-08-23 MED ORDER — PANTOPRAZOLE SODIUM 40 MG PO TBEC
40.0000 mg | DELAYED_RELEASE_TABLET | Freq: Every day | ORAL | Status: DC
  Administered 2011-08-23 – 2011-08-25 (×3): 40 mg via ORAL
  Filled 2011-08-23 (×4): qty 1

## 2011-08-23 MED ORDER — ALBUTEROL SULFATE HFA 108 (90 BASE) MCG/ACT IN AERS
2.0000 | INHALATION_SPRAY | Freq: Four times a day (QID) | RESPIRATORY_TRACT | Status: DC | PRN
  Filled 2011-08-23: qty 6.7

## 2011-08-23 MED ORDER — HYDROCHLOROTHIAZIDE 25 MG PO TABS
25.0000 mg | ORAL_TABLET | Freq: Every day | ORAL | Status: DC
Start: 2011-08-24 — End: 2011-08-26
  Administered 2011-08-24 – 2011-08-26 (×3): 25 mg via ORAL
  Filled 2011-08-23 (×3): qty 1

## 2011-08-23 MED ORDER — LIDOCAINE HCL (CARDIAC) 20 MG/ML IV SOLN
INTRAVENOUS | Status: DC | PRN
  Administered 2011-08-23: 100 mg via INTRAVENOUS

## 2011-08-23 MED ORDER — DOCUSATE SODIUM 100 MG PO CAPS
100.0000 mg | ORAL_CAPSULE | Freq: Two times a day (BID) | ORAL | Status: DC
  Administered 2011-08-23 – 2011-08-26 (×6): 100 mg via ORAL
  Filled 2011-08-23 (×7): qty 1

## 2011-08-23 MED ORDER — CIPROFLOXACIN IN D5W 400 MG/200ML IV SOLN
INTRAVENOUS | Status: AC
  Filled 2011-08-23: qty 200

## 2011-08-23 MED ORDER — LEVOTHYROXINE SODIUM 150 MCG PO TABS
300.0000 ug | ORAL_TABLET | Freq: Every day | ORAL | Status: DC
  Administered 2011-08-24 – 2011-08-26 (×3): 300 ug via ORAL
  Filled 2011-08-23 (×3): qty 2

## 2011-08-23 MED ORDER — LIDOCAINE HCL 2 % EX GEL
CUTANEOUS | Status: AC
  Filled 2011-08-23: qty 10

## 2011-08-23 MED ORDER — TAMSULOSIN HCL 0.4 MG PO CAPS
0.4000 mg | ORAL_CAPSULE | Freq: Every day | ORAL | Status: DC
  Administered 2011-08-24 – 2011-08-26 (×3): 0.4 mg via ORAL
  Filled 2011-08-23 (×3): qty 1

## 2011-08-23 MED ORDER — FENTANYL CITRATE 0.05 MG/ML IJ SOLN
INTRAMUSCULAR | Status: DC | PRN
  Administered 2011-08-23: 50 ug via INTRAVENOUS
  Administered 2011-08-23 (×2): 100 ug via INTRAVENOUS

## 2011-08-23 MED ORDER — ACETAMINOPHEN 10 MG/ML IV SOLN
INTRAVENOUS | Status: AC
  Filled 2011-08-23: qty 100

## 2011-08-23 MED ORDER — POTASSIUM CHLORIDE IN NACL 20-0.9 MEQ/L-% IV SOLN
INTRAVENOUS | Status: AC
  Administered 2011-08-23: 1000 mL
  Filled 2011-08-23: qty 1000

## 2011-08-23 MED ORDER — DIPHENHYDRAMINE HCL 50 MG/ML IJ SOLN: 12.5000 mg | Freq: Four times a day (QID) | INTRAMUSCULAR | Status: DC | PRN

## 2011-08-23 MED ORDER — POTASSIUM CHLORIDE CRYS ER 20 MEQ PO TBCR
20.0000 meq | EXTENDED_RELEASE_TABLET | Freq: Two times a day (BID) | ORAL | Status: DC
  Administered 2011-08-23 – 2011-08-25 (×4): 20 meq via ORAL
  Filled 2011-08-23 (×7): qty 1

## 2011-08-23 MED ORDER — POTASSIUM CHLORIDE IN NACL 20-0.9 MEQ/L-% IV SOLN
INTRAVENOUS | Status: DC
  Administered 2011-08-23 – 2011-08-25 (×6): via INTRAVENOUS
  Filled 2011-08-23 (×7): qty 1000

## 2011-08-23 MED ORDER — LACTATED RINGERS IV SOLN
INTRAVENOUS | Status: DC | PRN
  Administered 2011-08-23 (×2): via INTRAVENOUS

## 2011-08-23 MED ORDER — ZOLPIDEM TARTRATE 5 MG PO TABS: 5.0000 mg | ORAL_TABLET | Freq: Every evening | ORAL | Status: DC | PRN

## 2011-08-23 MED ORDER — IOHEXOL 300 MG/ML  SOLN
INTRAMUSCULAR | Status: AC
  Filled 2011-08-23: qty 1

## 2011-08-23 MED ORDER — ONDANSETRON HCL 4 MG/2ML IJ SOLN
4.0000 mg | INTRAMUSCULAR | Status: DC | PRN
  Administered 2011-08-25: 4 mg via INTRAVENOUS
  Filled 2011-08-23: qty 2

## 2011-08-23 MED ORDER — LEVOTHYROXINE SODIUM 25 MCG PO TABS
25.0000 ug | ORAL_TABLET | Freq: Every day | ORAL | Status: DC
  Administered 2011-08-24 – 2011-08-26 (×3): 25 ug via ORAL
  Filled 2011-08-23 (×3): qty 1

## 2011-08-23 SURGICAL SUPPLY — 13 items
ADAPTER CATH URET PLST 4-6FR (CATHETERS) ×2 IMPLANT
ADPR CATH URET STRL DISP 4-6FR (CATHETERS) ×1
BAG URO CATCHER STRL LF (DRAPE) ×2 IMPLANT
CATH INTERMIT  6FR 70CM (CATHETERS) ×2 IMPLANT
CLOTH BEACON ORANGE TIMEOUT ST (SAFETY) ×2 IMPLANT
DRAPE CAMERA CLOSED 9X96 (DRAPES) ×2 IMPLANT
GLOVE BIOGEL M STRL SZ7.5 (GLOVE) ×2 IMPLANT
GOWN STRL NON-REIN LRG LVL3 (GOWN DISPOSABLE) ×4 IMPLANT
GUIDEWIRE STR DUAL SENSOR (WIRE) ×2 IMPLANT
MANIFOLD NEPTUNE II (INSTRUMENTS) ×2 IMPLANT
PACK CYSTO (CUSTOM PROCEDURE TRAY) ×2 IMPLANT
STENT CONTOUR URETERAL (STENTS) ×2 IMPLANT
TUBING CONNECTING 10 (TUBING) ×2 IMPLANT

## 2011-08-23 NOTE — Interval H&P Note (Signed)
History and Physical Interval Note:  08/23/2011 5:27 PM  Jon Carson  has presented today for surgery, with the diagnosis of left ureteral obstruction and fever. He was noted to have a fever 103.  Due to his fever, he will undergo urgent treatment tonight rather than elective treatment tomorrow.  The various methods of treatment have been discussed with the patient and family. After consideration of risks, benefits and other options for treatment, the patient has consented to  Procedure(s) (LRB): CYSTOSCOPY WITH RETROGRADE PYELOGRAM/URETERAL STENT PLACEMENT (Left) as a surgical intervention .   Questions were answered to the patient's satisfaction.     Iyani Dresner,[name]

## 2011-08-23 NOTE — Pre-Procedure Instructions (Signed)
PT'S TEMP IS 103.5 --HEATHER AT DR. BORDEN'S OFFICE NOTIFIED--DR. BORDEN SAYS PT NEEDS TO GO SURGERY TODAY.  PCR NASAL SWAB, CBC, BMET, EKG AND CXR WERE DONE TODAY.  PT WENT TO RADIOLOGY BY STRETCHER--AND WILL GO TO SHORT STAY FROM RADIOLOGY.  CHART TAKEN TO SHORT STAY.

## 2011-08-23 NOTE — Patient Instructions (Signed)
YOUR SURGERY IS SCHEDULED ON:  Thursday  3/21  AT 11:OO AM  REPORT TO [facility] AT:  9:00 AM      PHONE # FOR SHORT STAY IS [phone]  DO NOT EAT OR DRINK ANYTHING AFTER MIDNIGHT THE NIGHT BEFORE YOUR SURGERY.  YOU MAY BRUSH YOUR TEETH, RINSE OUT YOUR MOUTH--BUT NO WATER, NO FOOD, NO CHEWING GUM, NO MINTS, NO CANDIES, NO CHEWING TOBACCO.  PLEASE TAKE THE FOLLOWING MEDICATIONS THE AM OF YOUR SURGERY WITH A FEW SIPS OF WATER:  TRAMADOL, LEVOTHYROXINE, OMEPRAZOLE, TAMSULOSIN    IF YOU USE INHALERS--USE YOUR INHALERS THE AM OF YOUR SURGERY AND BRING INHALERS TO THE HOSPITAL -TAKE TO SURGERY.    IF YOU ARE DIABETIC:  DO NOT TAKE ANY DIABETIC MEDICATIONS THE AM OF YOUR SURGERY.  IF YOU TAKE INSULIN IN THE EVENINGS--PLEASE ONLY TAKE 1/2 NORMAL EVENING DOSE THE NIGHT BEFORE YOUR SURGERY.  NO INSULIN THE AM OF YOUR SURGERY.  IF YOU HAVE SLEEP APNEA AND USE CPAP OR BIPAP--PLEASE BRING THE MASK --NOT THE MACHINE-NOT THE TUBING   -JUST THE MASK. DO NOT BRING VALUABLES, MONEY, CREDIT CARDS.  CONTACT LENS, DENTURES / PARTIALS, GLASSES SHOULD NOT BE WORN TO SURGERY AND IN MOST CASES-HEARING AIDS WILL NEED TO BE REMOVED.  BRING YOUR GLASSES CASE, ANY EQUIPMENT NEEDED FOR YOUR CONTACT LENS. FOR PATIENTS ADMITTED TO THE HOSPITAL--CHECK OUT TIME THE DAY OF DISCHARGE IS 11:00 AM.  ALL INPATIENT ROOMS ARE PRIVATE - WITH BATHROOM, TELEPHONE, TELEVISION AND WIFI INTERNET. IF YOU ARE BEING DISCHARGED THE SAME DAY OF YOUR SURGERY--YOU CAN NOT DRIVE YOURSELF HOME--AND SHOULD NOT GO HOME ALONE BY TAXI OR BUS.  NO DRIVING OR OPERATING MACHINERY FOR 24 HOURS FOLLOWING ANESTHESIA / PAIN MEDICATIONS.                            SPECIAL INSTRUCTIONS:  CHLORHEXIDINE SOAP SHOWER (other brand names are Betasept and Hibiclens ) PLEASE SHOWER WITH CHLORHEXIDINE THE NIGHT BEFORE YOUR SURGERY AND THE AM OF YOUR SURGERY. DO NOT USE CHLORHEXIDINE ON YOUR FACE OR PRIVATE AREAS--YOU MAY USE YOUR NORMAL SOAP THOSE  AREAS AND YOUR NORMAL SHAMPOO.  WOMEN SHOULD AVOID SHAVING UNDER ARMS AND SHAVING LEGS 48 HOURS BEFORE USING CHLORHEXIDINE TO AVOID SKIN IRRITATION.  DO NOT USE IF ALLERGIC TO CHLORHEXIDINE.  PLEASE READ OVER ANY  FACT SHEETS THAT YOU WERE GIVEN: MRSA INFORMATION

## 2011-08-23 NOTE — Pre-Procedure Instructions (Signed)
[facility] WITH PORTABLE EQUIPMENT NOTIFIED PT WILL NEED BARI BED AFTER SURGERY TONIGHT -PT BEING ADMITTED AND WT IS [id number]

## 2011-08-23 NOTE — Op Note (Signed)
Preoperative diagnosis:  1. Left ureteral stone with fever   Postoperative diagnosis:  1. Left ureteral stone with fever   Procedure:  1. Cystoscopy 2. Left ureteral stent placement (6 x 26) 3. Left retrograde pyelography with interpretation  Surgeon: Moody Bruins. M.D.  Anesthesia: General  Complications: None  Intraoperative findings: Left RPG demonstrated a filling defect in the proximal ureter consistent with his known stone.  EBL: Minimal  Specimens: 1) Urine culture from bladder 2) Urine culture from left renal pelvis  Indication: Jon Carson is a 46 y.o. patient with ureteral obstruction due to a left ureteral stone and he was found to have fever. After reviewing the management options for treatment, he elected to proceed with the above surgical procedure(s). We have discussed the potential benefits and risks of the procedure, side effects of the proposed treatment, the likelihood of the patient achieving the goals of the procedure, and any potential problems that might occur during the procedure or recuperation. Informed consent has been obtained.  Description of procedure:  The patient was taken to the operating room and general anesthesia was induced.  The patient was placed in the dorsal lithotomy position, prepped and draped in the usual sterile fashion, and preoperative antibiotics were administered. A preoperative time-out was performed.   Cystourethroscopy was performed.  The patient's urethra was examined and was somewhat narrow and ischemic appearing throughout the anterior urethra consistent with the patient's known stricture disease.  The 22 Fr cystoscope was able to be passed without significant difficulty into the bladder without the need for dilation.  The bladder was then systematically examined in its entirety. There was no evidence for any bladder tumors, stones, or other mucosal pathology.  A culture was obtained from the bladder urine.  A  0.38 sensor guidewire was then advanced up the left ureter into the renal pelvis under fluoroscopic guidance. A 6 Fr ureteral catheter was then advanced up over the wire into the left renal pelvis and another culture was obtained from the left renal pelvis. The wire was then replaced. The wire was then backloaded through the cystoscope and a ureteral stent was advance over the wire using Seldinger technique.  The stent was positioned appropriately under fluoroscopic and cystoscopic guidance.  The wire was then removed with an adequate stent curl noted in the renal pelvis as well as in the bladder.  The bladder was then emptied and the procedure ended.  The patient appeared to tolerate the procedure well and without complications.  The patient was able to be awakened and transferred to the recovery unit in satisfactory condition.    Moody Bruins MD

## 2011-08-23 NOTE — H&P (Signed)
History of Present Illness  [name] is a 46 year old with the following urologic history:  1) Left renal mass: He was found to have an incidentally detected 5 cm left lower pole renal mass.  This had been seen in retrospect on an ultrasound he had done around the time of his cholecystectomy in 2008.  The mass appeared to be similar in size but was limited by the patient's obesity.  His initial CT was performed without contrast.  He underwent a CT with IV contrast on a separate day and it raised concern that his lesion was enhancing.  However, his scan was severely limited by his obesity. He weighed approximately 470 pounds during his initial evaluation by me in August 2009. In September 2009, he underwent a CT scan with and without IV contrast which demonstrated some possible enhancement of about 10 HU. He also underwent an FNA biopsy which did not demonstrate malignancy but was a limited specimen. He finally was able to undergo a definitive MRI in 2011 which demonstrated his renal mass to be a simple cyst.  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 Etta Grandchild and more recent balloon dilation by Dr. Sherron Monday. He currently performs catherization with a 14 Fr catheter weekly.  Interval history:  He follows up today with a new complaint of a kidney stone. He developed the acute onset of severe left-sided lower back pain that began on Saturday causing him to present to the emergency department. A CT scan was performed which demonstrated a 6 mm proximal left ureteral stone as well as a 4 mm left lower pole renal calculus. His pain was able to be controlled and he was discharged home. Since then, he has had significant difficulty with nausea and constipation related to his pain medication. He has therefore been trying not to take any narcotic pain medication but has continued to have pain. At this time, he does not feel that  he would do well to continue observation and try to pass the stone. He also is concerned about getting back to work. He denies any fever and has never had any prior episodes of kidney stones.   Past Medical History Problems  1. History of  Adult Sleep Apnea 780.57 2. History of  Arthritis V13.4 3. History of  Chronic Reflux Esophagitis 530.11 4. History of  Hypothyroidism 244.9  Surgical History Problems  1. History of  Cholecystectomy Laparoscopic 2. History of  External Urethrotomy 3. History of  Incisional Hernia Repair With Implantation Of Mesh 4. History of  Knee Surgery 5. History of  Shoulder Surgery Left 6. History of  Stapedectomy  Current Meds 1. Hydrochlorothiazide TABS; Therapy: (Recorded:20May2011) to 2. Levothyroxine Sodium TABS; Therapy: (Recorded:04Apr2008) to 3. MetroNIDAZOLE 500 MG Oral Tablet; Therapy: 29Sep2012 to 4. Mobic 15 MG Oral Tablet; Therapy: (Recorded:18Sep2009) to 5. Omeprazole CPDR; Therapy: (Recorded:04Apr2008) to 6. Ondansetron HCl 4 MG Oral Tablet; Therapy: 16Mar2013 to 7. Oxycodone-Acetaminophen 5-325 MG Oral Tablet; Therapy: 16Mar2013 to 8. Tamsulosin HCl 0.4 MG Oral Capsule; Therapy: 16Mar2013 to 9. Triamterene-HCTZ 37.5-25 MG Oral Tablet; Therapy: 21Jan2013 to 10. Ventolin HFA 108 (90 Base) MCG/ACT Inhalation Aerosol Solution; Therapy: 25Feb2013 to  Allergies Medication  1. No Known Drug Allergies  Family History Problems  1. Paternal history of  Diabetes Mellitus V18.0 2. Maternal history of  Hypertension V17.49  Social History Problems  1. Marital History - Currently Married 2. Never A Smoker  Review of Systems  Genitourinary: no hematuria.  Gastrointestinal: nausea, vomiting and constipation.  Constitutional: no fever.    Vitals Vital Signs [Data Includes: Last 1 Day]  19Mar2013 01:01PM  BMI Calculated: 57.75 BSA Calculated: 3.07 Height: 6 ft 2 in 19Mar2013 01:00PM  Weight: 450 lb  Blood Pressure: 144 / 80 Heart Rate:  99  Physical Exam Constitutional: Well nourished and well developed . No acute distress.  ENT:. The ears and nose are normal in appearance.  Neck: The appearance of the neck is normal and no neck mass is present.  Pulmonary: No respiratory distress, normal respiratory rhythm and effort and clear bilateral breath sounds.  Cardiovascular: Heart rate and rhythm are normal . No peripheral edema.  Abdomen: The abdomen is obese. The abdomen is soft and nontender. No masses are palpated. No CVA tenderness. No hernias are palpable. No hepatosplenomegaly noted.  Skin: Normal skin turgor, no visible rash and no visible skin lesions.  Neuro/Psych:. Mood and affect are appropriate.    Results/Data Urine [Data Includes: Last 1 Day]   19Mar2013  COLOR ORANGE   APPEARANCE CLEAR   SPECIFIC GRAVITY 1.020   pH 5.5   GLUCOSE NEG mg/dL  BILIRUBIN SMALL   KETONE NEG mg/dL  BLOOD MOD   PROTEIN 161 mg/dL  UROBILINOGEN 4 mg/dL  NITRITE NEG   LEUKOCYTE ESTERASE NEG   SQUAMOUS EPITHELIAL/HPF RARE   WBC 0-3 WBC/hpf  RBC 0-3 RBC/hpf  BACTERIA RARE   CRYSTALS NONE SEEN   CASTS NONE SEEN     I have reviewed his recent medical records. Serum creatinine on 3/16 was 0.88. White blood count 11.3. His urinalysis demonstrated 11-20 white blood cells and 5-6 red blood cells. I independently reviewed his CT scan with findings as dictated above.   Assessment Assessed  1. Ureteral Stone 592.1  Plan Health Maintenance (V70.0)  1. UA With REFLEX  Done: 19Mar2013 12:42PM Ureteral Stone (592.1)  2. Follow-up Schedule Surgery Office  Follow-up  Requested for: 19Mar2013  Discussion/Summary 1. Left ureteral and renal calculi: He is not doing well with medical expulsion therapy/observation. We therefore discussed treatment options. He is not a candidate for shockwave lithotripsy based on his size. After reviewing the options for treatment, he has agreed to proceed with left ureteroscopic laser lithotripsy. He  understands the probable need for ureteral stent postoperatively as well as the possibility that he might require a ureteral stent prior to definitive treatment of his stone pending the caliber of his ureter. We have reviewed the potential risks and complications of this procedure in detail today including but not limited to bleeding, infection, damage to the ureter, stricture to the ureter, loss of kidney, et Karie Soda. He understands the possible need for further therapy and additional procedures. We also discussed the expected postoperative recovery process. He does wish to proceed and gives his informed consent today.  2. Urethral stricture: He continues to perform self catheterization once weekly with a 14 French catheter and has not had any difficulty. He understands that he may require cystoscopic dilation if a stricture is present at his upcoming procedure.     Signatures Electronically signed by : Heloise Purpura, M.D.; Aug 22 2011  1:53PM

## 2011-08-23 NOTE — Discharge Instructions (Signed)

## 2011-08-23 NOTE — Anesthesia Preprocedure Evaluation (Addendum)
Anesthesia Evaluation  Patient identified by MRN, date of birth, ID band Patient awake    Reviewed: Allergy & Precautions, H&P , NPO status , Patient's Chart, lab work & pertinent test results, reviewed documented beta blocker date and time   Airway Mallampati: III TM Distance: >3 FB Neck ROM: full    Dental No notable dental hx. (+) Dental Advisory Given and Teeth Intact   Pulmonary shortness of breath and with exertion, sleep apnea and Continuous Positive Airway Pressure Ventilation , Recent URI , Resolved,  breath sounds clear to auscultation  Pulmonary exam normal       Cardiovascular Exercise Tolerance: Poor hypertension, Rhythm:regular Rate:Normal     Neuro/Psych negative neurological ROS  negative psych ROS   GI/Hepatic negative GI ROS, Neg liver ROS, GERD-  Medicated and Controlled,  Endo/Other  Hypothyroidism Morbid obesity  Renal/GU negative Renal ROS  negative genitourinary   Musculoskeletal   Abdominal (+) + obese,   Peds  Hematology negative hematology ROS (+)   Anesthesia Other Findings   Reproductive/Obstetrics negative OB ROS                         Anesthesia Physical Anesthesia Plan  ASA: III  Anesthesia Plan: General   Post-op Pain Management:    Induction: Intravenous  Airway Management Planned: Oral ETT  Additional Equipment:   Intra-op Plan:   Post-operative Plan: Extubation in OR  Informed Consent: I have reviewed the patients History and Physical, chart, labs and discussed the procedure including the risks, benefits and alternatives for the proposed anesthesia with the patient or authorized representative who has indicated his/her understanding and acceptance.   Dental Advisory Given  Plan Discussed with: CRNA and Surgeon  Anesthesia Plan Comments:         Anesthesia Quick Evaluation

## 2011-08-23 NOTE — Anesthesia Postprocedure Evaluation (Signed)
  Anesthesia Post-op Note  Patient: Jon Carson  Procedure(s) Performed: Procedure(s) (LRB): CYSTOSCOPY WITH RETROGRADE PYELOGRAM/URETERAL STENT PLACEMENT (Left)  Patient Location: PACU  Anesthesia Type: General  Level of Consciousness: awake and alert   Airway and Oxygen Therapy: Patient Spontanous Breathing  Post-op Pain: mild  Post-op Assessment: Post-op Vital signs reviewed, Patient's Cardiovascular Status Stable, Respiratory Function Stable, Patent Airway and No signs of Nausea or vomiting  Post-op Vital Signs: stable  Complications: No apparent anesthesia complications

## 2011-08-23 NOTE — Transfer of Care (Signed)
Immediate Anesthesia Transfer of Care Note  Patient: Jon Carson  Procedure(s) Performed: Procedure(s) (LRB): CYSTOSCOPY WITH RETROGRADE PYELOGRAM/URETERAL STENT PLACEMENT (Left)  Patient Location: PACU  Anesthesia Type: General  Level of Consciousness: awake, alert  and oriented  Airway & Oxygen Therapy: Patient Spontanous Breathing and Patient connected to face mask oxygen  Post-op Assessment: Report given to PACU RN and Post -op Vital signs reviewed and stable  Post vital signs: Reviewed and stable  Complications: No apparent anesthesia complications

## 2011-08-24 ENCOUNTER — Encounter (HOSPITAL_COMMUNITY)
Admission: AD | Disposition: A | Discharge status: Home / Self Care | Payer: Self-pay | Source: Ambulatory Visit | Attending: Urology

## 2011-08-24 ENCOUNTER — Encounter (HOSPITAL_COMMUNITY): Payer: Self-pay

## 2011-08-24 LAB — URINE CULTURE
Colony Count: NO GROWTH
Culture  Setup Time: 201303210203
Culture: NO GROWTH

## 2011-08-24 LAB — CBC
HCT: 33.1 % — ABNORMAL LOW (ref 39.0–52.0)
Hemoglobin: 11 g/dL — ABNORMAL LOW (ref 13.0–17.0)
MCH: 29.7 pg (ref 26.0–34.0)
MCHC: 33.2 g/dL (ref 30.0–36.0)
MCV: 89.5 fL (ref 78.0–100.0)
Platelets: 213 10*3/uL (ref 150–400)
RBC: 3.7 MIL/uL — ABNORMAL LOW (ref 4.22–5.81)
RDW: 13.9 % (ref 11.5–15.5)
WBC: 12.3 10*3/uL — ABNORMAL HIGH (ref 4.0–10.5)

## 2011-08-24 LAB — BASIC METABOLIC PANEL
BUN: 12 mg/dL (ref 6–23)
CO2: 30 mEq/L (ref 19–32)
Calcium: 8.2 mg/dL — ABNORMAL LOW (ref 8.4–10.5)
Chloride: 93 mEq/L — ABNORMAL LOW (ref 96–112)
Creatinine, Ser: 1.47 mg/dL — ABNORMAL HIGH (ref 0.50–1.35)
GFR calc Af Amer: 65 mL/min — ABNORMAL LOW (ref >90)
GFR calc non Af Amer: 56 mL/min — ABNORMAL LOW (ref >90)
Glucose, Bld: 100 mg/dL — ABNORMAL HIGH (ref 70–99)
Potassium: 3.2 mEq/L — ABNORMAL LOW (ref 3.5–5.1)
Sodium: 133 mEq/L — ABNORMAL LOW (ref 135–145)

## 2011-08-24 SURGERY — CYSTOSCOPY WITH URETEROSCOPY
Anesthesia: General | Laterality: Left

## 2011-08-24 MED ORDER — BIOTENE DRY MOUTH MT LIQD
15.0000 mL | Freq: Two times a day (BID) | OROMUCOSAL | Status: DC
  Administered 2011-08-24 – 2011-08-25 (×4): 15 mL via OROMUCOSAL

## 2011-08-24 MED ORDER — POTASSIUM CHLORIDE CRYS ER 20 MEQ PO TBCR
20.0000 meq | EXTENDED_RELEASE_TABLET | Freq: Once | ORAL | Status: AC
  Administered 2011-08-24: 20 meq via ORAL
  Filled 2011-08-24: qty 1

## 2011-08-24 MED ORDER — CHLORHEXIDINE GLUCONATE 0.12 % MT SOLN
15.0000 mL | Freq: Two times a day (BID) | OROMUCOSAL | Status: DC
  Administered 2011-08-24 – 2011-08-26 (×5): 15 mL via OROMUCOSAL
  Filled 2011-08-24 (×7): qty 15

## 2011-08-24 NOTE — Progress Notes (Signed)
Spoke with patient regarding cpap.  Patient stated he uses bipap at home.  Patient brought his machine in with tubing and nasal mask, but machine requires servicing.  Pt setup on hospital provided bipap machine and set on 16/10 per his home settings. Pt tolerating well at this time. RN aware.

## 2011-08-24 NOTE — Progress Notes (Signed)
Called to patient's bedside by RN.  Pt felt the bipap pressure was not high enough.  Adjusted bipap settings to 18/14 per pt request and comfort.  Pt tolerating this well at this time.  Rn aware.

## 2011-08-24 NOTE — Progress Notes (Signed)
Patient ID: Jon Carson, male   DOB: Oct 27, 1965, 46 y.o.   MRN: [id number]  1 Day Post-Op Subjective: S/P left ureteral stent after presenting with obstructing ureteral stone and fever yesterday. No fever overnight.  Pain improved.  No nausea or vomiting.  Objective: Vital signs in last 24 hours: Temp:  [97.8 F (36.6 C)-103.5 F (39.7 C)] 98.1 F (36.7 C) (03/21 0635) Pulse Rate:  [78-99] 81  (03/21 0635) Resp:  [14-22] 18  (03/21 0635) BP: (98-140)/(47-86) 121/69 mmHg (03/21 0635) SpO2:  [92 %-100 %] 94 % (03/21 0635) Weight:  [211.376 kg (466 lb)-211.8 kg (466 lb 15 oz)] 211.8 kg (466 lb 15 oz) (03/20 2237)  Intake/Output from previous day: 03/20 0701 - 03/21 0700 In: 2694.6 [P.O.:480; I.V.:2214.6] Out: 1400 [Urine:1400] Intake/Output this shift:    Physical Exam:  General: Alert and oriented CV: RRR Lungs: Clear Back: No CVAT  Lab Results:  Basename 08/24/11 0518 08/23/11 2006 08/23/11 1520  HGB 11.0* 11.5* 12.2*  HCT 33.1* 34.1* 36.8*   Lab Results  Component Value Date   WBC 12.3* 08/24/2011   HGB 11.0* 08/24/2011   HCT 33.1* 08/24/2011   MCV 89.5 08/24/2011   PLT 213 08/24/2011    BMET  Basename 08/24/11 0518 08/23/11 2006  NA 133* 132*  K 3.2* 2.5*  CL 93* 92*  CO2 30 30  GLUCOSE 100* 106*  BUN 12 13  CREATININE 1.47* 1.67*  CALCIUM 8.2* 8.3*     Studies/Results: Blood and urine cultures pending  Assessment/Plan: 1) Left ureteral stone and infection: Continue ceftriaxone pending culture results. 2) Hypokalemia: Replace K +   LOS: 1 day   Davie Claud,[name] 08/24/2011, 7:23 AM

## 2011-08-25 ENCOUNTER — Inpatient Hospital Stay (HOSPITAL_COMMUNITY): Payer: Managed Care, Other (non HMO)

## 2011-08-25 LAB — CBC
HCT: 34.1 % — ABNORMAL LOW (ref 39.0–52.0)
Hemoglobin: 11.2 g/dL — ABNORMAL LOW (ref 13.0–17.0)
MCH: 29.5 pg (ref 26.0–34.0)
MCHC: 32.8 g/dL (ref 30.0–36.0)
MCV: 89.7 fL (ref 78.0–100.0)
Platelets: 232 10*3/uL (ref 150–400)
RBC: 3.8 MIL/uL — ABNORMAL LOW (ref 4.22–5.81)
RDW: 14 % (ref 11.5–15.5)
WBC: 12 10*3/uL — ABNORMAL HIGH (ref 4.0–10.5)

## 2011-08-25 LAB — BASIC METABOLIC PANEL
BUN: 14 mg/dL (ref 6–23)
CO2: 32 mEq/L (ref 19–32)
Calcium: 7.8 mg/dL — ABNORMAL LOW (ref 8.4–10.5)
Chloride: 97 mEq/L (ref 96–112)
Creatinine, Ser: 1.34 mg/dL (ref 0.50–1.35)
GFR calc Af Amer: 73 mL/min — ABNORMAL LOW (ref >90)
GFR calc non Af Amer: 63 mL/min — ABNORMAL LOW (ref >90)
Glucose, Bld: 125 mg/dL — ABNORMAL HIGH (ref 70–99)
Potassium: 3 mEq/L — ABNORMAL LOW (ref 3.5–5.1)
Sodium: 135 mEq/L (ref 135–145)

## 2011-08-25 LAB — URINE CULTURE
Colony Count: >=100000
Culture  Setup Time: 201303210203

## 2011-08-25 MED ORDER — SULFAMETHOXAZOLE-TMP DS 800-160 MG PO TABS
1.0000 | ORAL_TABLET | Freq: Two times a day (BID) | ORAL | Status: DC
  Administered 2011-08-25 – 2011-08-26 (×2): 1 via ORAL
  Filled 2011-08-25 (×3): qty 1

## 2011-08-25 MED ORDER — VITAMINS A & D EX OINT
TOPICAL_OINTMENT | CUTANEOUS | Status: AC
  Administered 2011-08-25: 5
  Filled 2011-08-25: qty 5

## 2011-08-25 MED ORDER — ACETAMINOPHEN 325 MG PO TABS
ORAL_TABLET | ORAL | Status: AC
  Administered 2011-08-25: 650 mg via ORAL
  Filled 2011-08-25: qty 2

## 2011-08-25 MED ORDER — ACETAMINOPHEN 325 MG PO TABS
650.0000 mg | ORAL_TABLET | ORAL | Status: DC | PRN
  Administered 2011-08-25 – 2011-08-26 (×4): 650 mg via ORAL
  Filled 2011-08-25 (×3): qty 2

## 2011-08-25 MED ORDER — AMOXICILLIN-POT CLAVULANATE 875-125 MG PO TABS: 1.0000 | ORAL_TABLET | Freq: Two times a day (BID) | ORAL | Status: DC

## 2011-08-25 MED ORDER — ACETAMINOPHEN 325 MG PO TABS
650.0000 mg | ORAL_TABLET | ORAL | Status: AC
  Administered 2011-08-25: 650 mg via ORAL

## 2011-08-25 MED ORDER — ACETAMINOPHEN 325 MG PO TABS
ORAL_TABLET | ORAL | Status: AC
  Filled 2011-08-25: qty 2

## 2011-08-25 MED ORDER — AMOXICILLIN-POT CLAVULANATE 875-125 MG PO TABS
1.0000 | ORAL_TABLET | Freq: Two times a day (BID) | ORAL | Status: DC
  Administered 2011-08-25 – 2011-08-26 (×2): 1 via ORAL
  Filled 2011-08-25 (×3): qty 1

## 2011-08-25 MED ORDER — CIPROFLOXACIN IN D5W 400 MG/200ML IV SOLN
400.0000 mg | Freq: Once | INTRAVENOUS | Status: DC
  Filled 2011-08-25: qty 200

## 2011-08-25 MED ORDER — SULFAMETHOXAZOLE-TMP DS 800-160 MG PO TABS: 1.0000 | ORAL_TABLET | Freq: Two times a day (BID) | ORAL | Status: DC

## 2011-08-25 MED ORDER — POTASSIUM CHLORIDE CRYS ER 20 MEQ PO TBCR
40.0000 meq | EXTENDED_RELEASE_TABLET | Freq: Once | ORAL | Status: AC
  Administered 2011-08-25: 40 meq via ORAL
  Filled 2011-08-25: qty 2

## 2011-08-25 NOTE — Progress Notes (Signed)
Pt with a temp of 103.1. Pt denies feeling feverish at this time. Pt states, "I'm not sure that is right." Temp repeated on a different thermometer with a temp of 103.0. Page sent via [facility] for the on-call NP. Diane Myrtie Soman notified of these temps and new orders received. Orders entered. Will follow and pass along to day shift RN as well.

## 2011-08-25 NOTE — Progress Notes (Signed)
Patient ID: Jon Carson, male   DOB: Mar 25, 1966, 46 y.o.   MRN: [id number]  2 Days Post-Op Subjective: Pt with fever to 103 F this morning.  No fever yesterday and for over 24 hrs from presentation until this morning. No flank pain or hematuria.  Pt had chills but no other complaints. Tolerating po intake well.  Objective: Vital signs in last 24 hours: Temp:  [98.2 F (36.8 C)-103 F (39.4 C)] 100 F (37.8 C) (03/22 4132) Pulse Rate:  [77-99] 99  (03/22 0638) Resp:  [20-22] 22  (03/22 0638) BP: (115-149)/(67-87) 149/71 mmHg (03/22 0638) SpO2:  [98 %-99 %] 99 % (03/22 4401)  Intake/Output from previous day: 03/21 0701 - 03/22 0700 In: 3590.8 [P.O.:1585; I.V.:1905.8; IV Piggyback:100] Out: 2450 [Urine:2450] Intake/Output this shift:    Physical Exam:  General: Alert and oriented Abdomen: Soft, ND Ext: NT, No erythema  Lab Results:  Basename 08/25/11 0503 08/24/11 0518 08/23/11 2006  HGB 11.2* 11.0* 11.5*  HCT 34.1* 33.1* 34.1*   Lab Results  Component Value Date   WBC 12.0* 08/25/2011   HGB 11.2* 08/25/2011   HCT 34.1* 08/25/2011   MCV 89.7 08/25/2011   PLT 232 08/25/2011    BMET  Basename 08/25/11 0503 08/24/11 0518  NA 135 133*  K 3.0* 3.2*  CL 97 93*  CO2 32 30  GLUCOSE 125* 100*  BUN 14 12  CREATININE 1.34 1.47*  CALCIUM 7.8* 8.2*     Studies/Results: Urine culture from bladder with no growth.  Urine culture from left renal pelvis with multiple bacteria.  No predominant bacterial organism.  Blood cultures still pending  Assessment/Plan: -- Fever curve is unusual for pyelonephritis.  ? Other source such as viral infection.  Will continue IV antibiotics and await preliminary blood culture results.  If also no growth, will start empiric oral antibiotic therapy later today.  Will then plan to D/C home with oral antibiotics if WBC and fever curve not concerning over next 24 hrs.  -- Hypokalemia: replace K +   LOS: 2 days   [name],[name] 08/25/2011,  7:28 AM

## 2011-08-25 NOTE — Progress Notes (Addendum)
Patient ID: Jon Carson, male   DOB: 1966/02/27, 46 y.o.   MRN: [id number]  2 Days Post-Op Subjective: Pt with persistent fever during the day up to 102F. No nausea.  Tolerating po intake although somewhat poorer today.  He did have a bowel movement.  No flank pain.  Objective: Vital signs in last 24 hours: Temp:  [98.2 F (36.8 C)-103 F (39.4 C)] 101 F (38.3 C) (03/22 1532) Pulse Rate:  [77-99] 92  (03/22 1532) Resp:  [20-22] 20  (03/22 1532) BP: (131-151)/(66-87) 140/76 mmHg (03/22 1532) SpO2:  [90 %-99 %] 90 % (03/22 1532)  Intake/Output from previous day: 03/21 0701 - 03/22 0700 In: 3590.8 [P.O.:1585; I.V.:1905.8; IV Piggyback:100] Out: 2450 [Urine:2450] Intake/Output this shift:    Physical Exam:  General: Alert and oriented   Lab Results:  Blood cultures are preliminarily negative.  CXR negative for pneumonia.  Urine cultures have not demonstrated definite bacterial infection.     Assessment/Plan: 1) Fever / ? Pyelonephritis: It was initially assumed that he had a renal infection with obstruction although considering the clinical scenario with an atypical fever curve and symptoms and cultures which do not support this etiology, there is a question whether his fever is due to other causes such as a virus. He will be treated empirically for pyelonephritis with broad spectrum coverage and I will start Augmentin and Bactrim tonight and plan to continue for 2 weeks.  If he is clinically improving tomorrow and his WBC is not worsening, I think he can be discharged home with plans to have him followup with his PCP if still having fever by Monday.  If not appearing clinically well tomorrow or if WBC worsening, it may be prudent to consider an Internal Medicine or Infectious Disease consultation in the hospital and may need alternative antibiotic regimen.  2) Hypokalemia: He received replacement today.  Will recheck BMP in am.   LOS: 2 days   Annasofia Pohl,[name] 08/25/2011, 7:03  PM

## 2011-08-26 LAB — BASIC METABOLIC PANEL
BUN: 9 mg/dL (ref 6–23)
CO2: 33 mEq/L — ABNORMAL HIGH (ref 19–32)
Calcium: 8.2 mg/dL — ABNORMAL LOW (ref 8.4–10.5)
Chloride: 95 mEq/L — ABNORMAL LOW (ref 96–112)
Creatinine, Ser: 1.16 mg/dL (ref 0.50–1.35)
GFR calc Af Amer: 86 mL/min — ABNORMAL LOW (ref >90)
GFR calc non Af Amer: 75 mL/min — ABNORMAL LOW (ref >90)
Glucose, Bld: 106 mg/dL — ABNORMAL HIGH (ref 70–99)
Potassium: 3.2 mEq/L — ABNORMAL LOW (ref 3.5–5.1)
Sodium: 135 mEq/L (ref 135–145)

## 2011-08-26 LAB — CBC
HCT: 33 % — ABNORMAL LOW (ref 39.0–52.0)
Hemoglobin: 10.7 g/dL — ABNORMAL LOW (ref 13.0–17.0)
MCH: 29.2 pg (ref 26.0–34.0)
MCHC: 32.4 g/dL (ref 30.0–36.0)
MCV: 90.2 fL (ref 78.0–100.0)
Platelets: 281 10*3/uL (ref 150–400)
RBC: 3.66 MIL/uL — ABNORMAL LOW (ref 4.22–5.81)
RDW: 14.2 % (ref 11.5–15.5)
WBC: 17.4 10*3/uL — ABNORMAL HIGH (ref 4.0–10.5)

## 2011-08-26 MED ORDER — AMOXICILLIN-POT CLAVULANATE 875-125 MG PO TABS: 1.0000 | ORAL_TABLET | Freq: Two times a day (BID) | ORAL | Status: AC

## 2011-08-26 MED ORDER — SULFAMETHOXAZOLE-TMP DS 800-160 MG PO TABS: 1.0000 | ORAL_TABLET | Freq: Two times a day (BID) | ORAL | Status: AC

## 2011-08-26 NOTE — Progress Notes (Signed)
3 Days Post-Op Subjective: Patient reports that he is feeling fine, and in no pain. He has a little bit of gas, but no abdominal discomfort.  Objective: Vital signs in last 24 hours: Temp:  [99.3 F (37.4 C)-103 F (39.4 C)] 99.3 F (37.4 C) (03/23 0543) Pulse Rate:  [79-99] 79  (03/23 0543) Resp:  [20-22] 20  (03/23 0543) BP: (124-151)/(66-82) 124/72 mmHg (03/23 0543) SpO2:  [90 %-99 %] 93 % (03/23 0543)  Intake/Output from previous day: 03/22 0701 - 03/23 0700 In: 1440 [P.O.:1440] Out: 2000 [Urine:2000] Intake/Output this shift: Total I/O In: 960 [P.O.:960] Out: 1200 [Urine:1200]  Physical Exam:  Constitutional: Vital signs reviewed. He appears comfortable   Lab Results:  Basename 08/26/11 0438 08/25/11 0503 08/24/11 0518  HGB 10.7* 11.2* 11.0*  HCT 33.0* 34.1* 33.1*   BMET  Basename 08/26/11 0438 08/25/11 0503  NA 135 135  K 3.2* 3.0*  CL 95* 97  CO2 33* 32  GLUCOSE 106* 125*  BUN 9 14  CREATININE 1.16 1.34  CALCIUM 8.2* 7.8*   No results found for this basename: LABPT:3,INR:3 in the last 72 hours No results found for this basename: LABURIN:1 in the last 72 hours Results for orders placed during the hospital encounter of 08/23/11  URINE CULTURE     Status: Normal   Collection Time   08/23/11  6:46 PM      Component Value Range Status Comment   Specimen Description BLADDER   Final    Special Requests NONE   Final    Culture  Setup Time [id number]   Final    Colony Count NO GROWTH   Final    Culture NO GROWTH   Final    Report Status 08/24/2011 FINAL   Final   URINE CULTURE     Status: Normal   Collection Time   08/23/11  6:46 PM      Component Value Range Status Comment   Specimen Description KIDNEY LEFT RENAL PELVIS   Final    Special Requests NONE   Final    Culture  Setup Time [id number]   Final    Colony Count >=100,000 COLONIES/ML   Final    Culture     Final    Value: Multiple bacterial morphotypes present, none predominant. Suggest  appropriate recollection if clinically indicated.   Report Status 08/25/2011 FINAL   Final   CULTURE, BLOOD (ROUTINE X 2)     Status: Normal (Preliminary result)   Collection Time   08/23/11  8:00 PM      Component Value Range Status Comment   Specimen Description BLOOD RIGHT ARM   Final    Special Requests BOTTLES DRAWN AEROBIC AND ANAEROBIC 10CC   Final    Culture  Setup Time [id number]   Final    Culture     Final    Value:        BLOOD CULTURE RECEIVED NO GROWTH TO DATE CULTURE WILL BE HELD FOR 5 DAYS BEFORE ISSUING A FINAL NEGATIVE REPORT   Report Status PENDING   Incomplete   CULTURE, BLOOD (ROUTINE X 2)     Status: Normal (Preliminary result)   Collection Time   08/23/11  8:10 PM      Component Value Range Status Comment   Specimen Description BLOOD RIGHT ARM   Final    Special Requests BOTTLES DRAWN AEROBIC AND ANAEROBIC 4CC   Final    Culture  Setup Time [id number]   Final    Culture  Final    Value:        BLOOD CULTURE RECEIVED NO GROWTH TO DATE CULTURE WILL BE HELD FOR 5 DAYS BEFORE ISSUING A FINAL NEGATIVE REPORT   Report Status PENDING   Incomplete     Studies/Results: Dg Chest Port 1 View  08/25/2011  *RADIOLOGY REPORT*  Clinical Data: Fever  PORTABLE CHEST - 1 VIEW  Comparison: Chest x-ray of 08/23/2011  Findings: No active infiltrate or effusion is seen.  The heart is mildly enlarged and stable.  No bony abnormality is noted through  IMPRESSION: Stable mild cardiomegaly.  No active lung disease.  Original Report Authenticated By: Juline Patch, M.D.    Assessment/Plan:   Status post left ureteral stent for obstructing stone with pyelonephritis.    Postprocedural fever, with decreasing fever curve. The patient feels better, despite slight increased white blood count.    Since the patient feels better, and his fever curve is much better, I think it worthwhile to let him go despite leukocytosis. I will let him go with sulfa and Augmentin. I think it  worthwhile to followup with his PCP on Monday.   LOS: 3 days   Marcine Matar M 08/26/2011, 6:30 AM

## 2011-08-26 NOTE — Discharge Summary (Signed)
Date of admission: 08/23/2011  Date of discharge: 08/26/2011  Admission diagnosis: Left ureteral obstruction due to left ureteral stone, fever  Discharge diagnosis: Same  Secondary diagnoses: Morbid obesity  History and Physical: For full details, please see admission history and physical. Briefly, Jon Carson is a 46 y.o. year old patient with a 6 mm left ureteral stone.  He was noted to have fever of 103F during his preoperative visit the day before his planned elective ureteroscopic laser lithotripsy procedure.   Hospital Course: When he was noted to have fever to 103F during his pre-op visit, it was decided to urgently proceed to the OR for left ureteral stent placement.  This was performed the evening of 08/23/11.  He tolerated this procedure well.  Blood cultures and urine cultures from the bladder and left renal pelvis were obtained.  He remained stable and was afebrile for over 24 hrs after the procedure.  On 08/25/11, his cultures returned with no growth from the bladder urine and multiple bacteria from the left renal pelvis possibly suggesting contamination vs. multi-bacterial infection.  He was kept on IV ceftriaxone during his hospitalization and his WBC trended downward.  However, about 36 hrs after his procedure, he developed recurrent fever.  His CXR was negative for a pulmonary source and his blood cultures were preliminarily negative. He was tolerating po intake and it was decided to place him on oral po antibiotics with broad spectrum coverage with Augmentin and Bactrim.  He was evaluated on the morning on 08/26/11 by Dr. Retta Diones.  Although he did have a increase in his WBC and continued to have intermittent fever, he remained clinically stable and was tolerating po intake and it was felt he could be discharged home with po antibiotics with plans to followup with his PCP Monday if still having fever to be evaluated for an alternative source of infection if still having persistent  signs and symptoms.  Laboratory values:  Basename 08/26/11 0438 08/25/11 0503 08/24/11 0518  HGB 10.7* 11.2* 11.0*  HCT 33.0* 34.1* 33.1*    Basename 08/26/11 0438 08/25/11 0503  CREATININE 1.16 1.34    Disposition: Home  Discharge instruction: He has no physical restrictions.  He will followup with his PCP Monday if still having fever.  He will return to the ED this weekend if he develops nausea or vomiting or other concerning symptoms.  Discharge medications:  Medication List  As of 08/26/2011 10:20 AM   START taking these medications         amoxicillin-clavulanate 875-125 MG per tablet   Commonly known as: AUGMENTIN   Take 1 tablet by mouth 2 (two) times daily.      sulfamethoxazole-trimethoprim 800-160 MG per tablet   Commonly known as: BACTRIM DS   Take 1 tablet by mouth 2 (two) times daily.         CONTINUE taking these medications         ALBUTEROL SULFATE IN      hydrochlorothiazide 25 MG tablet   Commonly known as: HYDRODIURIL      * levothyroxine 300 MCG tablet   Commonly known as: SYNTHROID, LEVOTHROID      * levothyroxine 25 MCG tablet   Commonly known as: SYNTHROID, LEVOTHROID      meloxicam 15 MG tablet   Commonly known as: MOBIC      mulitivitamin with minerals Tabs      omeprazole 20 MG capsule   Commonly known as: PRILOSEC      ondansetron  4 MG tablet   Commonly known as: ZOFRAN   Take 1 tablet (4 mg total) by mouth every 6 (six) hours as needed for nausea.      Tamsulosin HCl 0.4 MG Caps   Commonly known as: FLOMAX     * Notice: This list has 2 medication(s) that are the same as other medications prescribed for you. Read the directions carefully, and ask your doctor or other care provider to review them with you.       STOP taking these medications         ibuprofen 800 MG tablet      naproxen sodium 220 MG tablet      oxyCODONE-acetaminophen 5-325 MG per tablet      traMADol 50 MG tablet          Where to get your  medications    These are the prescriptions that you need to pick up.   You may get these medications from any pharmacy.         amoxicillin-clavulanate 875-125 MG per tablet   sulfamethoxazole-trimethoprim 800-160 MG per tablet            Followup:  Follow-up Information    Please follow up. (09/19/11 at 10:45)

## 2011-08-30 LAB — CULTURE, BLOOD (ROUTINE X 2)
Culture  Setup Time: 201303210136
Culture  Setup Time: 201303210136
Culture: NO GROWTH
Culture: NO GROWTH

## 2011-08-31 LAB — CULTURE, BLOOD (ROUTINE X 2)
Culture  Setup Time: 201303221059
Culture  Setup Time: 201303221100
Culture: NO GROWTH
Culture: NO GROWTH

## 2011-09-01 ENCOUNTER — Encounter (HOSPITAL_COMMUNITY): Payer: Self-pay | Admitting: Urology

## 2011-09-12 ENCOUNTER — Encounter (HOSPITAL_COMMUNITY): Payer: Self-pay | Admitting: Pharmacy Technician

## 2011-09-20 ENCOUNTER — Encounter (HOSPITAL_COMMUNITY): Payer: Self-pay

## 2011-09-20 ENCOUNTER — Encounter (HOSPITAL_COMMUNITY)
Admission: RE | Admit: 2011-09-20 | Discharge: 2011-09-20 | Disposition: A | Discharge status: Home / Self Care | Payer: Managed Care, Other (non HMO) | Source: Ambulatory Visit | Attending: Urology | Admitting: Urology

## 2011-09-20 HISTORY — DX: Other specified health status: Z78.9

## 2011-09-20 LAB — CBC
HCT: 38.5 % — ABNORMAL LOW (ref 39.0–52.0)
Hemoglobin: 12.4 g/dL — ABNORMAL LOW (ref 13.0–17.0)
MCH: 29.2 pg (ref 26.0–34.0)
MCHC: 32.2 g/dL (ref 30.0–36.0)
MCV: 90.6 fL (ref 78.0–100.0)
Platelets: 286 10*3/uL (ref 150–400)
RBC: 4.25 MIL/uL (ref 4.22–5.81)
RDW: 14.3 % (ref 11.5–15.5)
WBC: 7.1 10*3/uL (ref 4.0–10.5)

## 2011-09-20 LAB — BASIC METABOLIC PANEL
BUN: 17 mg/dL (ref 6–23)
CO2: 28 mEq/L (ref 19–32)
Calcium: 9.3 mg/dL (ref 8.4–10.5)
Chloride: 101 mEq/L (ref 96–112)
Creatinine, Ser: 0.93 mg/dL (ref 0.50–1.35)
GFR calc Af Amer: >90 mL/min (ref >90)
GFR calc non Af Amer: >90 mL/min (ref >90)
Glucose, Bld: 82 mg/dL (ref 70–99)
Potassium: 3.8 mEq/L (ref 3.5–5.1)
Sodium: 138 mEq/L (ref 135–145)

## 2011-09-20 LAB — SURGICAL PCR SCREEN
MRSA, PCR: NEGATIVE
Staphylococcus aureus: NEGATIVE

## 2011-09-20 NOTE — Patient Instructions (Addendum)
21 Jon Carson  09/20/2011   Your procedure is scheduled on:  09/22/11  Report to SHORT STAY DEPT  at 11:45 AM.  Call this number if you have problems the morning of surgery: [phone]   Remember:   Do not eat food or drink liquids AFTER MIDNIGHT  May have clear liquids UNTIL 6 HOURS BEFORE SURGERY  Clear liquids include soda, tea, black coffee, apple or grape juice, broth.  Take these medicines the morning of surgery with A SIP OF WATER: SYNTHROID / OMEPRAZOLE / CLARATIN    Do not wear jewelry, make-up or nail polish.  Do not wear lotions, powders, or perfumes.   Do not shave legs or underarms 48 hrs. before surgery (men may shave face)  Do not bring valuables to the hospital.  Contacts, dentures or bridgework may not be worn into surgery.  Leave suitcase in the car. After surgery it may be brought to your room.  For patients admitted to the hospital, checkout time is 11:00 AM the day of discharge.   Patients discharged the day of surgery will not be allowed to drive home.  Name and phone number of your driver:   Special Instructions:   Please read over the following fact sheets that you were given: MRSA  Information               SHOWER WITH BETASEPT THE NIGHT BEFORE SURGERY AND THE MORNING OF SURGERY

## 2011-09-22 ENCOUNTER — Encounter (HOSPITAL_COMMUNITY): Payer: Self-pay | Admitting: Anesthesiology

## 2011-09-22 ENCOUNTER — Ambulatory Visit (HOSPITAL_COMMUNITY): Payer: Managed Care, Other (non HMO)

## 2011-09-22 ENCOUNTER — Encounter (HOSPITAL_COMMUNITY): Payer: Self-pay

## 2011-09-22 ENCOUNTER — Ambulatory Visit (HOSPITAL_COMMUNITY)
Admission: RE | Admit: 2011-09-22 | Discharge: 2011-09-22 | Disposition: A | Discharge status: Home / Self Care | Payer: Managed Care, Other (non HMO) | Source: Ambulatory Visit | Attending: Urology | Admitting: Urology

## 2011-09-22 ENCOUNTER — Encounter (HOSPITAL_COMMUNITY)
Admission: RE | Disposition: A | Discharge status: Home / Self Care | Payer: Self-pay | Source: Ambulatory Visit | Attending: Urology

## 2011-09-22 ENCOUNTER — Ambulatory Visit (HOSPITAL_COMMUNITY): Payer: Managed Care, Other (non HMO) | Admitting: Anesthesiology

## 2011-09-22 DIAGNOSIS — Z9089 Acquired absence of other organs: Secondary | ICD-10-CM | POA: Insufficient documentation

## 2011-09-22 DIAGNOSIS — N201 Calculus of ureter: Secondary | ICD-10-CM | POA: Insufficient documentation

## 2011-09-22 DIAGNOSIS — K219 Gastro-esophageal reflux disease without esophagitis: Secondary | ICD-10-CM | POA: Insufficient documentation

## 2011-09-22 DIAGNOSIS — E039 Hypothyroidism, unspecified: Secondary | ICD-10-CM | POA: Insufficient documentation

## 2011-09-22 DIAGNOSIS — Z01812 Encounter for preprocedural laboratory examination: Secondary | ICD-10-CM | POA: Insufficient documentation

## 2011-09-22 DIAGNOSIS — Z79899 Other long term (current) drug therapy: Secondary | ICD-10-CM | POA: Insufficient documentation

## 2011-09-22 DIAGNOSIS — N281 Cyst of kidney, acquired: Secondary | ICD-10-CM | POA: Insufficient documentation

## 2011-09-22 DIAGNOSIS — G4730 Sleep apnea, unspecified: Secondary | ICD-10-CM | POA: Insufficient documentation

## 2011-09-22 DIAGNOSIS — N200 Calculus of kidney: Secondary | ICD-10-CM | POA: Insufficient documentation

## 2011-09-22 DIAGNOSIS — N35919 Unspecified urethral stricture, male, unspecified site: Secondary | ICD-10-CM | POA: Insufficient documentation

## 2011-09-22 HISTORY — PX: CYSTOSCOPY W/ URETERAL STENT PLACEMENT: SHX1429

## 2011-09-22 SURGERY — CYSTOSCOPY, WITH RETROGRADE PYELOGRAM AND URETERAL STENT INSERTION
Anesthesia: General | Site: Ureter | Laterality: Left | Wound class: Clean Contaminated

## 2011-09-22 MED ORDER — ROCURONIUM BROMIDE 100 MG/10ML IV SOLN
INTRAVENOUS | Status: DC | PRN
  Administered 2011-09-22: 50 mg via INTRAVENOUS

## 2011-09-22 MED ORDER — GLYCOPYRROLATE 0.2 MG/ML IJ SOLN
INTRAMUSCULAR | Status: DC | PRN
  Administered 2011-09-22: .8 mg via INTRAVENOUS

## 2011-09-22 MED ORDER — LACTATED RINGERS IV SOLN: INTRAVENOUS | Status: DC

## 2011-09-22 MED ORDER — FENTANYL CITRATE 0.05 MG/ML IJ SOLN
INTRAMUSCULAR | Status: DC | PRN
  Administered 2011-09-22 (×4): 50 ug via INTRAVENOUS

## 2011-09-22 MED ORDER — PROMETHAZINE HCL 25 MG/ML IJ SOLN: 6.2500 mg | INTRAMUSCULAR | Status: DC | PRN

## 2011-09-22 MED ORDER — LIDOCAINE HCL (CARDIAC) 20 MG/ML IV SOLN
INTRAVENOUS | Status: DC | PRN
  Administered 2011-09-22: 100 mg via INTRAVENOUS

## 2011-09-22 MED ORDER — NEOSTIGMINE METHYLSULFATE 1 MG/ML IJ SOLN
INTRAMUSCULAR | Status: DC | PRN
  Administered 2011-09-22: 5 mg via INTRAVENOUS

## 2011-09-22 MED ORDER — ACETAMINOPHEN 10 MG/ML IV SOLN
INTRAVENOUS | Status: DC | PRN
  Administered 2011-09-22: 1000 mg via INTRAVENOUS

## 2011-09-22 MED ORDER — BELLADONNA ALKALOIDS-OPIUM 16.2-60 MG RE SUPP
RECTAL | Status: AC
  Filled 2011-09-22: qty 1

## 2011-09-22 MED ORDER — 0.9 % SODIUM CHLORIDE (POUR BTL) OPTIME
TOPICAL | Status: DC | PRN
  Administered 2011-09-22: 1000 mL

## 2011-09-22 MED ORDER — CIPROFLOXACIN HCL 500 MG PO TABS: 500.0000 mg | ORAL_TABLET | Freq: Two times a day (BID) | ORAL | Status: AC

## 2011-09-22 MED ORDER — CIPROFLOXACIN IN D5W 400 MG/200ML IV SOLN
INTRAVENOUS | Status: AC
  Filled 2011-09-22: qty 200

## 2011-09-22 MED ORDER — FENTANYL CITRATE 0.05 MG/ML IJ SOLN: 25.0000 ug | INTRAMUSCULAR | Status: DC | PRN

## 2011-09-22 MED ORDER — LACTATED RINGERS IV SOLN
INTRAVENOUS | Status: DC
  Administered 2011-09-22: 1000 mL via INTRAVENOUS
  Administered 2011-09-22: 16:00:00 via INTRAVENOUS

## 2011-09-22 MED ORDER — IOHEXOL 300 MG/ML  SOLN
INTRAMUSCULAR | Status: AC
  Filled 2011-09-22: qty 1

## 2011-09-22 MED ORDER — SODIUM CHLORIDE 0.9 % IR SOLN
Status: DC | PRN
  Administered 2011-09-22: 6000 mL

## 2011-09-22 MED ORDER — LIDOCAINE HCL 2 % EX GEL
CUTANEOUS | Status: AC
  Filled 2011-09-22: qty 10

## 2011-09-22 MED ORDER — ONDANSETRON HCL 4 MG/2ML IJ SOLN
INTRAMUSCULAR | Status: DC | PRN
  Administered 2011-09-22: 4 mg via INTRAVENOUS

## 2011-09-22 MED ORDER — CIPROFLOXACIN IN D5W 400 MG/200ML IV SOLN
400.0000 mg | INTRAVENOUS | Status: AC
  Administered 2011-09-22: 400 mg via INTRAVENOUS

## 2011-09-22 MED ORDER — HYDROCODONE-ACETAMINOPHEN 5-300 MG PO TABS: 1.0000 | ORAL_TABLET | Freq: Four times a day (QID) | ORAL | Status: DC

## 2011-09-22 MED ORDER — MEPERIDINE HCL 50 MG/ML IJ SOLN: 6.2500 mg | INTRAMUSCULAR | Status: DC | PRN

## 2011-09-22 MED ORDER — MIDAZOLAM HCL 5 MG/5ML IJ SOLN
INTRAMUSCULAR | Status: DC | PRN
  Administered 2011-09-22: 2 mg via INTRAVENOUS

## 2011-09-22 MED ORDER — PROPOFOL 10 MG/ML IV BOLUS
INTRAVENOUS | Status: DC | PRN
  Administered 2011-09-22: 200 mg via INTRAVENOUS

## 2011-09-22 MED ORDER — ACETAMINOPHEN 10 MG/ML IV SOLN
INTRAVENOUS | Status: AC
  Filled 2011-09-22: qty 100

## 2011-09-22 MED ORDER — IOHEXOL 300 MG/ML  SOLN: INTRAMUSCULAR | Status: DC | PRN

## 2011-09-22 SURGICAL SUPPLY — 23 items
ADAPTER CATH URET PLST 4-6FR (CATHETERS) ×2 IMPLANT
ADPR CATH URET STRL DISP 4-6FR (CATHETERS) ×1
BAG URO CATCHER STRL LF (DRAPE) ×2 IMPLANT
BASKET ZERO TIP NITINOL 2.4FR (BASKET) ×2 IMPLANT
BSKT STON RTRVL ZERO TP 2.4FR (BASKET) ×1
CATH INTERMIT  6FR 70CM (CATHETERS) ×2 IMPLANT
CLOTH BEACON ORANGE TIMEOUT ST (SAFETY) ×2 IMPLANT
DRAPE CAMERA CLOSED 9X96 (DRAPES) ×2 IMPLANT
GLOVE BIOGEL M STRL SZ7.5 (GLOVE) ×2 IMPLANT
GLOVE SURG SS PI 8.5 STRL IVOR (GLOVE) ×2
GLOVE SURG SS PI 8.5 STRL STRW (GLOVE) ×2 IMPLANT
GOWN BRE IMP SLV AUR XL STRL (GOWN DISPOSABLE) ×2 IMPLANT
GOWN STRL NON-REIN LRG LVL3 (GOWN DISPOSABLE) ×2 IMPLANT
GUIDEWIRE STR DUAL SENSOR (WIRE) ×2 IMPLANT
HOVERMATT HALF SINGLE USE (PATIENT TRANSFER) ×2 IMPLANT
IV NS IRRIG 3000ML ARTHROMATIC (IV SOLUTION) ×4 IMPLANT
LASER FIBER DISP (UROLOGICAL SUPPLIES) ×2 IMPLANT
MANIFOLD NEPTUNE II (INSTRUMENTS) ×2 IMPLANT
NS IRRIG 1000ML POUR BTL (IV SOLUTION) ×2 IMPLANT
PACK CYSTO (CUSTOM PROCEDURE TRAY) ×2 IMPLANT
SHEATH ACCESS URETERAL 38CM (SHEATH) ×2 IMPLANT
STENT CONTOUR 6FRX24X.038 (STENTS) ×2 IMPLANT
TUBING CONNECTING 10 (TUBING) ×2 IMPLANT

## 2011-09-22 NOTE — Anesthesia Preprocedure Evaluation (Addendum)
Anesthesia Evaluation  Patient identified by MRN, date of birth, ID band Patient awake    Reviewed: Allergy & Precautions, H&P , NPO status , Patient's Chart, lab work & pertinent test results  Airway Mallampati: II TM Distance: >3 FB Neck ROM: Full    Dental No notable dental hx.    Pulmonary neg pulmonary ROS, sleep apnea and Continuous Positive Airway Pressure Ventilation ,  breath sounds clear to auscultation  Pulmonary exam normal       Cardiovascular hypertension, Pt. on medications Rhythm:Regular Rate:Normal     Neuro/Psych negative neurological ROS  negative psych ROS   GI/Hepatic Neg liver ROS, GERD-  Medicated,  Endo/Other  negative endocrine ROSHypothyroidism Morbid obesity  Renal/GU negative Renal ROS  negative genitourinary   Musculoskeletal negative musculoskeletal ROS (+)   Abdominal   Peds negative pediatric ROS (+)  Hematology negative hematology ROS (+)   Anesthesia Other Findings   Reproductive/Obstetrics negative OB ROS                          Anesthesia Physical Anesthesia Plan  ASA: III  Anesthesia Plan: General   Post-op Pain Management:    Induction: Intravenous  Airway Management Planned: Oral ETT  Additional Equipment:   Intra-op Plan:   Post-operative Plan: Extubation in OR  Informed Consent: I have reviewed the patients History and Physical, chart, labs and discussed the procedure including the risks, benefits and alternatives for the proposed anesthesia with the patient or authorized representative who has indicated his/her understanding and acceptance.   Dental advisory given  Plan Discussed with: CRNA  Anesthesia Plan Comments:         Anesthesia Quick Evaluation

## 2011-09-22 NOTE — Op Note (Signed)
Preoperative diagnosis: Left ureteral and renal calculi  Postoperative diagnosis: Left renal calculi  Procedure:  1. Cystoscopy 2. Left ureteroscopy and stone removal 3. Ureteroscopic laser lithotripsy 4. Left ureteral stent placement (6 x 24)  Surgeon: Rolly Salter, Montez Hageman. M.D.  Anesthesia: General  Complications: None  EBL: Minimal  Specimens: 1. Left renal calculus  Disposition of specimens: [facility] for stone analysis  Indication: Jon Carson  is a 46 y.o. patient with urolithiasis. After reviewing the management options for treatment, he elected to proceed with the above surgical procedure(s). We have discussed the potential benefits and risks of the procedure, side effects of the proposed treatment, the likelihood of the patient achieving the goals of the procedure, and any potential problems that might occur during the procedure or recuperation. Informed consent has been obtained.  Description of procedure:  The patient was taken to the operating room and general anesthesia was induced.  The patient was placed in the dorsal lithotomy position, prepped and draped in the usual sterile fashion, and preoperative antibiotics were administered. A preoperative time-out was performed.   Cystourethroscopy was performed.  The patient's urethra was examined and demonstrated mild panurethral stricture disease as seen previously. The bladder was then systematically examined in its entirety. There was no evidence for any bladder tumors, stones, or other mucosal pathology.  His left ureteral stent was identified and removed with the flexible graspers.  A 0.38 sensor guidewire was then advanced up the left ureter into the renal pelvis under fluoroscopic guidance.  Semirigid ureteroscopy was then performed up to the level of the iliac vessels. No stone was identified. A 12/14 Fr ureteral access sheath was then advance over the guide wire up to the same level. The  digital flexible ureteroscope was then advanced through the access sheath into the ureter next to the guidewire and no ureteral calculi were identified.  The patient's stones were located in the renal collecting system with one larger stone measuring about 6 mm in the interpolar calyx and another small stone in the lower pole.   The stones were then fragmented with the 200 micron holmium laser fiber on a setting of 0.8J and frequency of 6 Hz. The stone fragmented very easily into small fragments.  All significant sized stones were then removed with a zero tip nitinol basket.   The safety wire was then replaced and the access sheath removed.  The guidewire was backloaded through the cystoscope and a ureteral stent was advance over the wire using Seldinger technique.  The stent was positioned appropriately under fluoroscopic and cystoscopic guidance.  The wire was then removed with an adequate stent curl noted in the renal pelvis as well as in the bladder.  The bladder was then emptied and the procedure ended.  The patient appeared to tolerate the procedure well and without complications.  The patient was able to be awakened and transferred to the recovery unit in satisfactory condition.   Moody Bruins MD

## 2011-09-22 NOTE — Anesthesia Postprocedure Evaluation (Signed)
  Anesthesia Post-op Note  Patient: Jon Carson  Procedure(s) Performed: Procedure(s) (LRB): CYSTOSCOPY WITH RETROGRADE PYELOGRAM/URETERAL STENT PLACEMENT (Left) HOLMIUM LASER APPLICATION (Left)  Patient Location: PACU  Anesthesia Type: General  Level of Consciousness: awake and alert   Airway and Oxygen Therapy: Patient Spontanous Breathing  Post-op Pain: mild  Post-op Assessment: Post-op Vital signs reviewed, Patient's Cardiovascular Status Stable, Respiratory Function Stable, Patent Airway and No signs of Nausea or vomiting  Post-op Vital Signs: stable  Complications: No apparent anesthesia complications

## 2011-09-22 NOTE — H&P (Signed)
History of Present Illness  Jon Carson is a 46 year old with the following urologic history:  1) Left renal cyst: He was found to have an incidentally detected 5 cm left lower pole renal mass.  This had been seen in retrospect on an ultrasound he had done around the time of his cholecystectomy in 2008.  The mass appeared to be similar in size but was limited by the patient's obesity.  His initial CT was performed without contrast.  He underwent a CT with IV contrast on a separate day and it raised concern that his lesion was enhancing.  However, his scan was severely limited by his obesity. He weighed approximately 470 pounds during his initial evaluation by me in August 2009. In September 2009, he underwent a CT scan with and without IV contrast which demonstrated some possible enhancement of about 10 HU. He also underwent an FNA biopsy which did not demonstrate malignancy but was a limited specimen. He finally was able to undergo a definitive MRI in 2011 which demonstrated his renal mass to be a simple cyst.  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 Etta Grandchild and more recent balloon dilation by Dr. Sherron Monday. He currently performs catherization with a 14 Fr catheter weekly. During cystoscopy for a stone procedure in 2013, he was noted to have panurethral stricture disease although a 22 Fr scope was able to be passed with minimal difficulty.  3) Urolithiasis: He presented with his initial stone episode in March 2013 and was noted to have multiple calculi.  Mar 2013: L stent for ureteral stone and fever  Interval history:  Jon Carson follows up today after completing a two-week course of antibiotics. He underwent urgent ureteral stent placement for a presumed infected left kidney and obstructing left ureteral stone a little over 2 weeks ago. He was admitted to the hospital and initially remained afebrile for  approximately 36 hours before again spiking a temperature. He was eventually discharged home on broad-spectrum antibiotics including Augmentin and Bactrim. He followed up with his primary care provider the following Monday to ensure that there was not a non-urologic source of his fever. His antibiotic was changed to ciprofloxacin. His urine cultures did not identify a specific bacteria. Regardless, he has now remained afebrile although has been significant fatigue due to his recent infection. He has expected stent symptoms but otherwise denies any specific complaints today.   Past Medical History Problems  1. History of  Adult Sleep Apnea 780.57 2. History of  Arthritis V13.4 3. History of  Chronic Reflux Esophagitis 530.11 4. History of  Hypothyroidism 244.9  Surgical History Problems  1. History of  Cholecystectomy Laparoscopic 2. History of  External Urethrotomy 3. History of  Incisional Hernia Repair With Implantation Of Mesh 4. History of  Knee Surgery 5. History of  Shoulder Surgery Left 6. History of  Stapedectomy  Current Meds 1. Hydrochlorothiazide TABS; Therapy: (Recorded:20May2011) to 2. Levothyroxine Sodium TABS; Therapy: (Recorded:04Apr2008) to 3. MetroNIDAZOLE 500 MG Oral Tablet; Therapy: 29Sep2012 to 4. Mobic 15 MG Oral Tablet; Therapy: (Recorded:18Sep2009) to 5. Omeprazole CPDR; Therapy: (Recorded:04Apr2008) to 6. Ondansetron HCl 4 MG Oral Tablet; Therapy: 16Mar2013 to 7. Oxycodone-Acetaminophen 5-325 MG Oral Tablet; Therapy: 16Mar2013 to 8. Tamsulosin HCl 0.4 MG Oral Capsule; Therapy: 16Mar2013 to 9. TraMADol HCl 50 MG Oral Tablet; Take 1-2 tablets every 6 hours as needed for pain; Therapy:  19Mar2013 to (Last Rx:19Mar2013)  Requested for:  19Mar2013 10. Triamterene-HCTZ 37.5-25 MG Oral Tablet; Therapy: 21Jan2013 to 11. Ventolin HFA 108 (90 Base) MCG/ACT Inhalation Aerosol Solution; Therapy: 25Feb2013 to  Allergies Medication  1. No Known Drug Allergies  Family  History Problems  1. Paternal history of  Diabetes Mellitus V18.0 2. Maternal history of  Hypertension V17.49  Social History Problems  1. Marital History - Currently Married 2. Never A Smoker  Vitals Vital Signs [Data Includes: Last 1 Day]  11Apr2013 02:46PM  Height: 6 ft 2 in Blood Pressure: 132 / 89 Heart Rate: 89  Physical Exam Constitutional: Well nourished and well developed . No acute distress.  Pulmonary: No respiratory distress, normal respiratory rhythm and effort and clear bilateral breath sounds.  Cardiovascular: Heart rate and rhythm are normal . No peripheral edema.  Abdomen: No CVA tenderness.  Neuro/Psych:. Mood and affect are appropriate.    Results/Data Urine [Data Includes: Last 1 Day]   11Apr2013  COLOR YELLOW   APPEARANCE CLEAR   SPECIFIC GRAVITY 1.015   pH 6.5   GLUCOSE NEG mg/dL  BILIRUBIN NEG   KETONE NEG mg/dL  BLOOD LARGE   PROTEIN NEG mg/dL  UROBILINOGEN 0.2 mg/dL  NITRITE NEG   LEUKOCYTE ESTERASE TRACE   SQUAMOUS EPITHELIAL/HPF NONE SEEN   WBC 0-3 WBC/hpf  RBC 21-50 RBC/hpf  BACTERIA NONE SEEN   CRYSTALS NONE SEEN   CASTS NONE SEEN     Urine will be cultured.   Plan Health Maintenance (V70.0)  1. UA With REFLEX  Done: 11Apr2013 02:22PM Ureteral Stone (592.1)  2. URINE CULTURE  Done: 11Apr2013 3. Follow-up Schedule Surgery Office  Follow-up  Done: 11Apr2013  Discussion/Summary  1. Left ureteral stone: It appears that he has resolved his recent infection. His urine will be cultured to ensure he does not require further antibiotic therapy prior to definitive treatment of the stone. Assuming that he does not, he is tentatively scheduled to receive with left ureteroscopic laser lithotripsy next week. We have reviewed the potential risks, complications, and alternative options associated with this procedure. He gives his informed consent to proceed.  CC: Lonie Peak, PA-C     Verified Results URINE CULTURE1 11Apr2013 03:00PM1  Lyda Perone  Source : CLEAN CATCH SPECIMEN TYPE: CLEAN CATCH   Test Name Result Flag Reference  CULTURE, URINE1 Culture, Urine1    ===== COLONY COUNT: =====  NO GROWTH   FINAL REPORT: NO GROWTH     1. Amended By: Heloise Purpura; 09/15/2011 7:53 PMEST  Signatures Electronically signed by : Heloise Purpura, M.D.; Sep 15 2011  7:53PM

## 2011-09-22 NOTE — Transfer of Care (Signed)
Immediate Anesthesia Transfer of Care Note  Patient: Jon Carson  Procedure(s) Performed: Procedure(s) (LRB): CYSTOSCOPY WITH RETROGRADE PYELOGRAM/URETERAL STENT PLACEMENT (Left) HOLMIUM LASER APPLICATION (Left)  Patient Location: PACU  Anesthesia Type: General  Level of Consciousness: awake, alert  and oriented  Airway & Oxygen Therapy: Patient Spontanous Breathing and Patient connected to face mask oxygen  Post-op Assessment: Report given to PACU RN and Post -op Vital signs reviewed and stable  Post vital signs: Reviewed and stable  Complications: No apparent anesthesia complications

## 2011-09-22 NOTE — Discharge Instructions (Signed)
1. You may see some blood in the urine and may have some burning with urination for 48-72 hours. You also may notice that you have to urinate more frequently or urgently after your procedure which is normal.  2. You should call should you develop an inability urinate, fever > 101, persistent nausea and vomiting that prevents you from eating or drinking to stay hydrated.  3. If you have a stent, you will likely urinate more frequently and urgently until the stent is removed and you may experience some discomfort/pain in the lower abdomen and flank especially when urinating. You may take pain medication prescribed to you if needed for pain. You may also intermittently have blood in the urine until the stent is removed. 4. You may remove the stent by pulling on the string from the urethra on Sunday afternoon.

## 2011-10-02 ENCOUNTER — Encounter (HOSPITAL_COMMUNITY): Payer: Self-pay | Admitting: Urology

## 2013-07-04 IMAGING — CT CT ABD-PELV W/O CM
1 series · 14 of 17 positions shown, 19 images · non-contrast
Comparison: 06/28/2009

CLINICAL DATA: Left flank pain

CT ABDOMEN AND PELVIS WITHOUT CONTRAST
TECHNIQUE: Multidetector CT imaging of the abdomen and pelvis was
performed following the standard protocol without intravenous
contrast.

[Series 6: lung · axial · 0.96mm/px · z∈[-107,-37]mm · 14 of 17 slices shown, 19 images]
[im 2/17  soft-tissue]
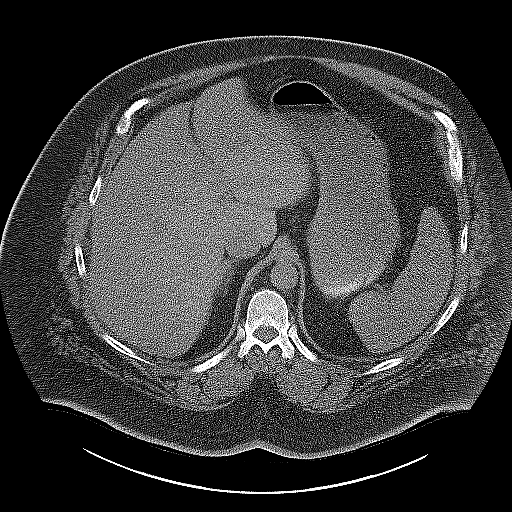
[im 2/17  bone]
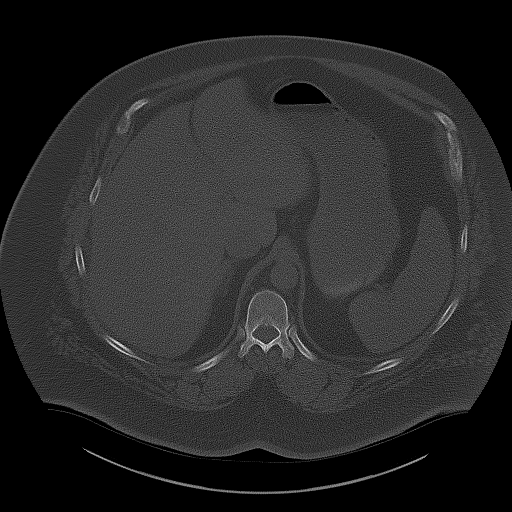
[im 3/17  soft-tissue]
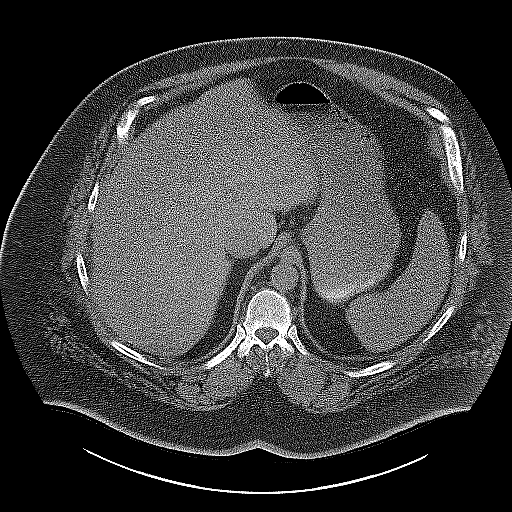
[im 4/17  soft-tissue]
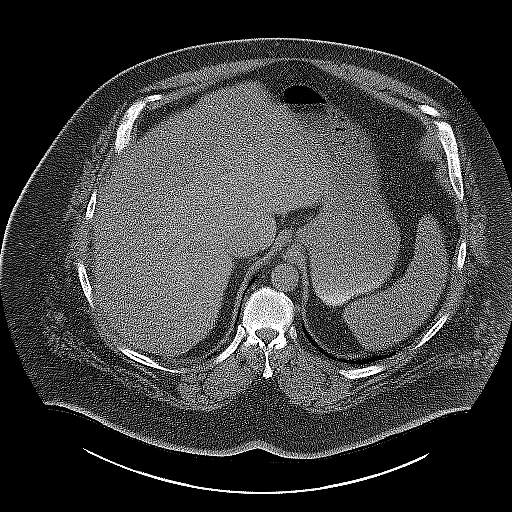
[im 6/17  soft-tissue]
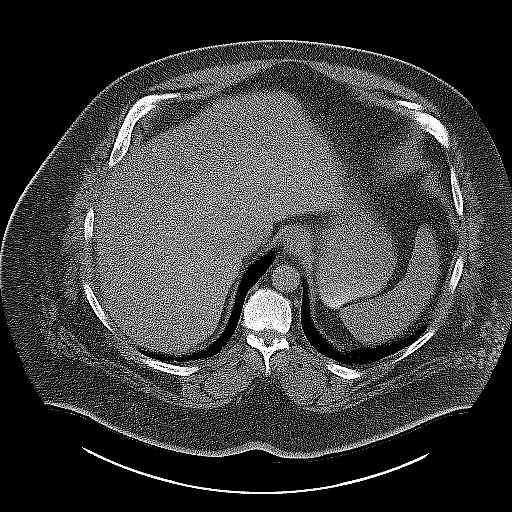
[im 7/17  soft-tissue]
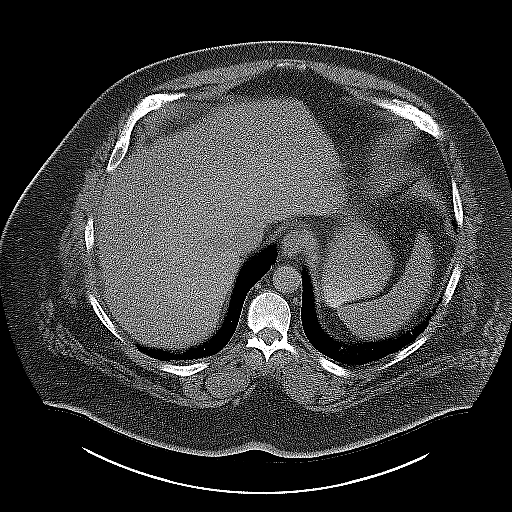
[im 8/17  soft-tissue]
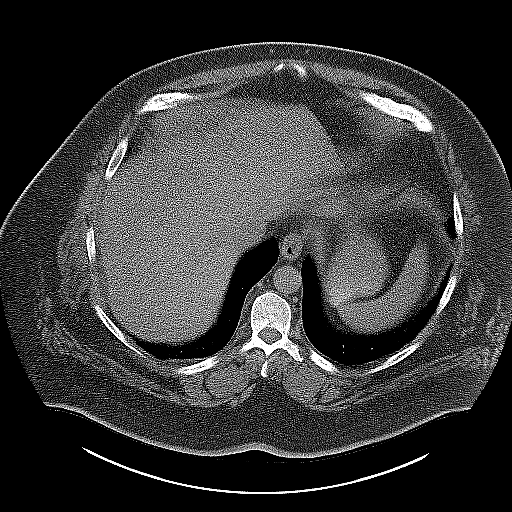
[im 9/17  soft-tissue]
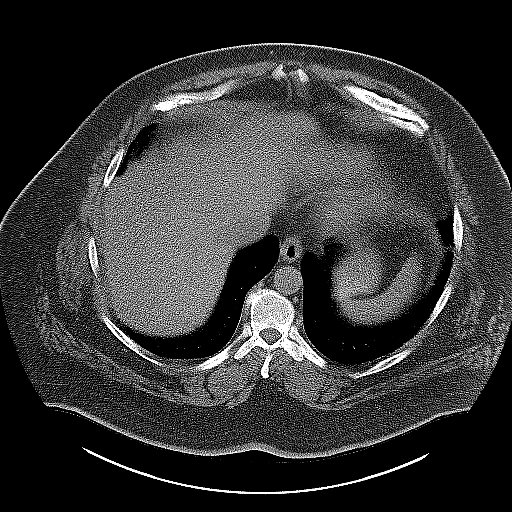
[im 10/17  soft-tissue]
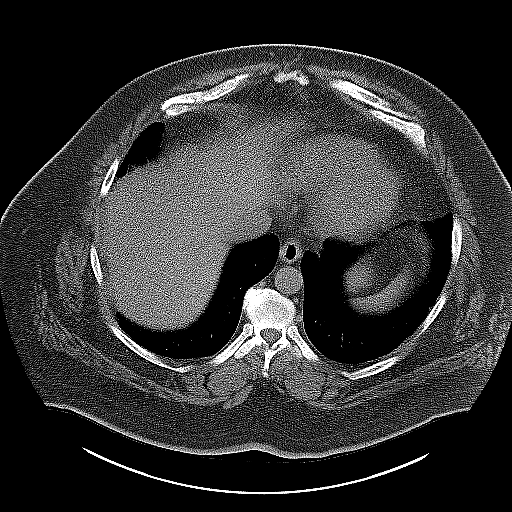
[im 11/17  soft-tissue]
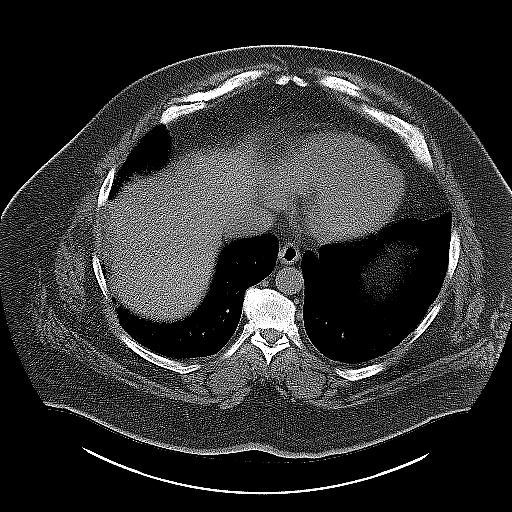
[im 11/17  bone]
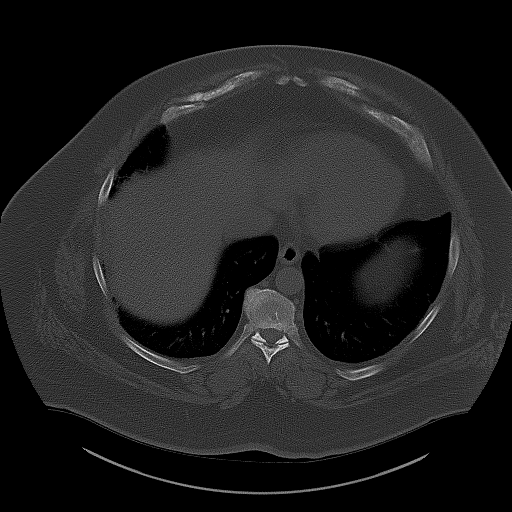
[im 12/17  soft-tissue]
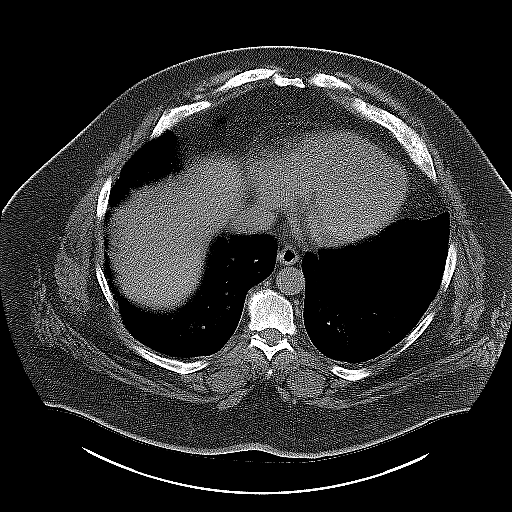
[im 13/17  lung]
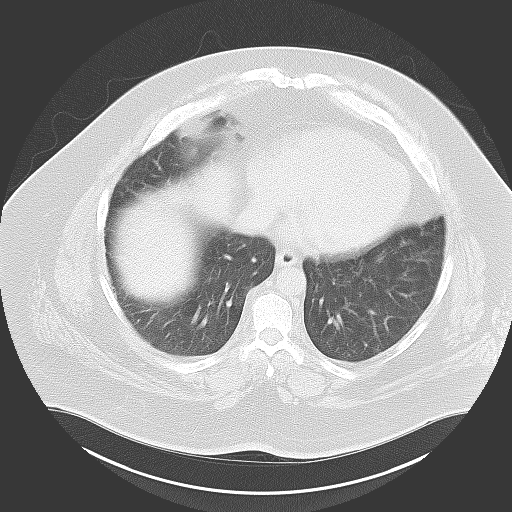
[im 14/17  soft-tissue]
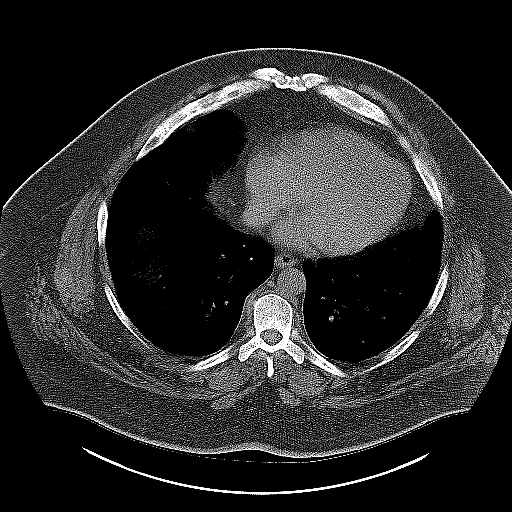
[im 14/17  lung]
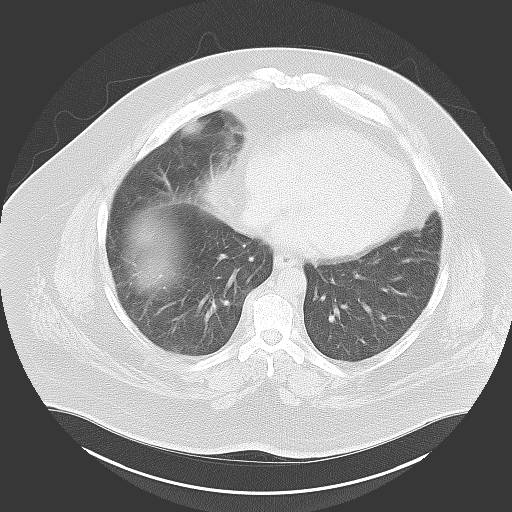
[im 15/17  soft-tissue]
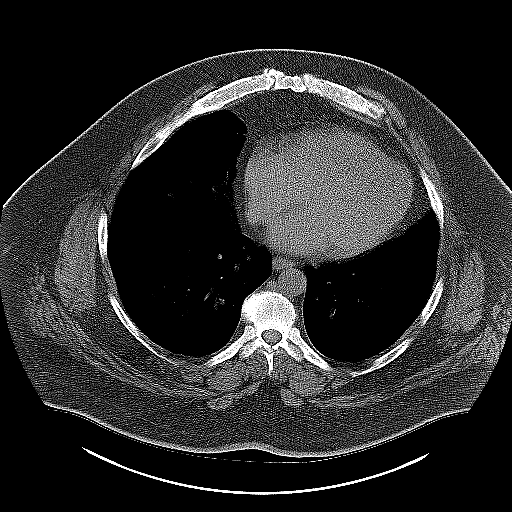
[im 15/17  lung]
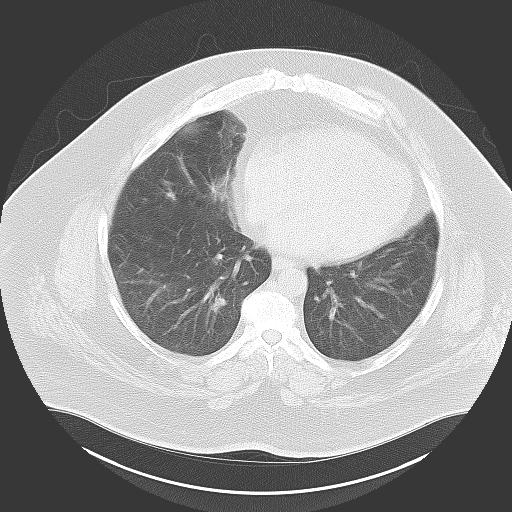
[im 16/17  soft-tissue]
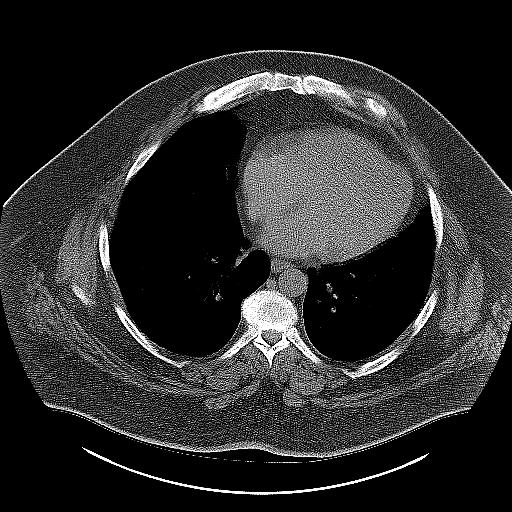
[im 16/17  lung]
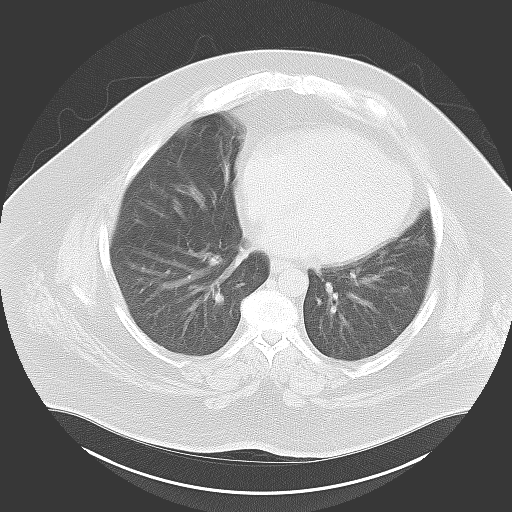

[14 of 17 positions shown; findings below may reference images not displayed]

FINDINGS: Lung bases are unremarkable.  Study is markedly limited
by streak artifacts from the patient's large body habitus.

Unenhanced liver is unremarkable.
The patient is status post cholecystectomy.  The unenhanced
pancreas, spleen and adrenal glands are unremarkable.

There is mild left hydronephrosis and left hydroureter.  There is a
nonobstructive calcified calculus in the lower pole of the left
kidney measures 4 mm.

In axial image 43 there is a calcified calculus in the proximal
left ureter measures 6 mm.  This is best visualized in the sagittal
image 58 at the level of the S3 vertebral body.  Mild left
perinephric stranding.

Again noted a poorly visualized lesion in the lower pole of the
left kidney measures about 5.3 cm.  This cannot be characterized
without IV contrast level is stable in size from prior exam.

No distal ureteral calcifications are noted bilaterally.

No small bowel obstruction.  No ascites or free air.  No
adenopathy.  There is no pericecal inflammation.  Normal appendix
is partially visualized axial image 51.  The urinary bladder is
under distended grossly unremarkable.  Prostate gland and seminal
vesicles are unremarkable.

Sagittal images of the spine shows disc space flattening at L5 S1
level.  There is posterior spurring with mild spinal canal stenosis
at T11-T12 and T12-L1 level.
IMPRESSION: 1.  There is mild left hydronephrosis and proximal left
hydroureter.
2.  6 mm obstructive calculus noted in the proximal left ureter at
the level of the L3 vertebral body.
3.  There is a 4 mm nonobstructive calculus in lower pole of the
left kidney.
4.  Again noted poorly visualized lesion in the lower pole of the
left kidney measures 5.3 cm.  This cannot be characterized without
IV contrast however the lesion is stable in size from prior exam.

5.  Normal appendix.

## 2013-07-08 IMAGING — RF DG ABDOMEN 1V
1 series · 1 of 1 positions shown · non-contrast
Comparison: 08/19/2011

CLINICAL DATA: Left ureteral stent placement

ABDOMEN - 1 VIEW

[Series 1: run · 1 of 1 slices shown]
[im 1/1]
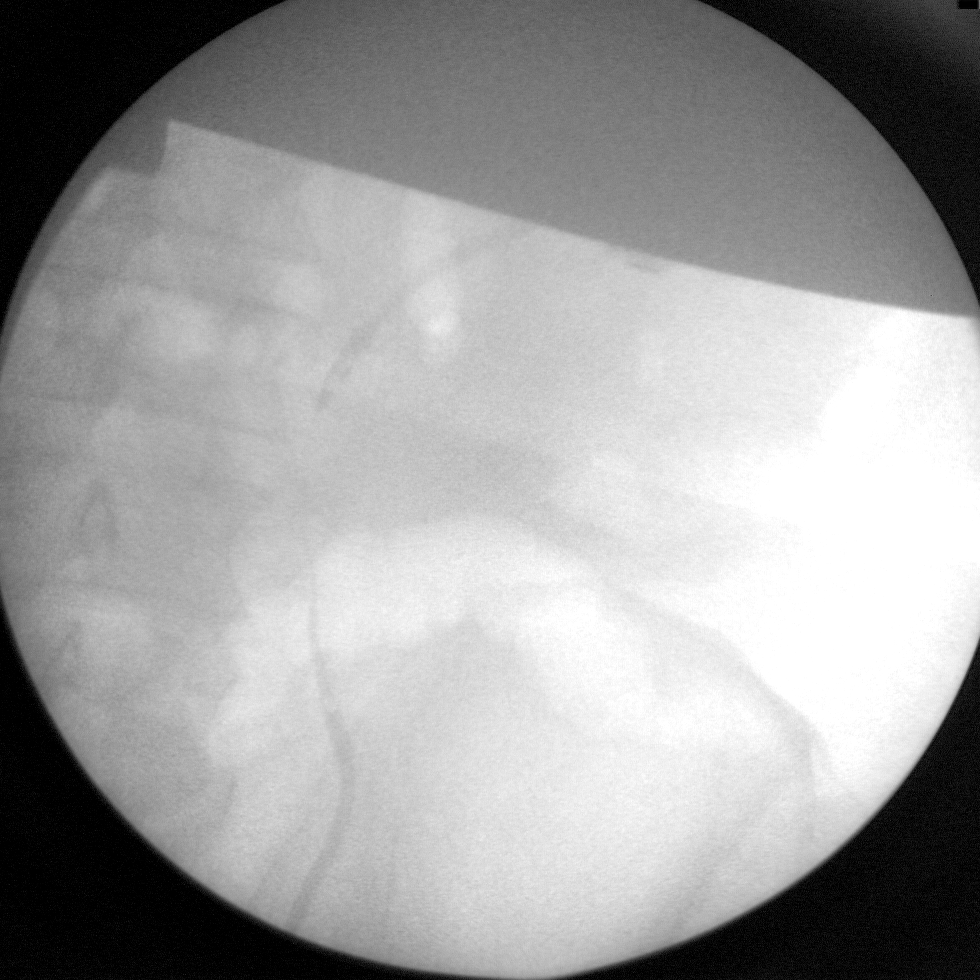

[1 of 1 positions shown; findings below may reference images not displayed]

FINDINGS: Intraoperative urogram of the left ureter performed.
Image demonstrates contrast opacification of the left mid ureter.
There are several filling defects within the left ureter which are
indeterminate and may represent gas bubbles or renal calculi.
IMPRESSION: 1.  Limited exam demonstrating contrast opacification of the left
ureter.
2.  Filling defects within the left ureter are indeterminate and
may represent gas bubbles or stones.

## 2013-07-17 ENCOUNTER — Ambulatory Visit: Payer: Self-pay | Admitting: Physician Assistant

## 2013-08-01 ENCOUNTER — Telehealth: Payer: Self-pay | Admitting: Critical Care Medicine

## 2013-08-04 NOTE — Telephone Encounter (Signed)
Jon Carson, can you pls help with scheduling this?  Thank you.

## 2013-08-04 NOTE — Telephone Encounter (Signed)
Patient scheduled for 09/09/13 @ 3:30.

## 2013-08-13 ENCOUNTER — Encounter: Payer: Self-pay | Admitting: Internal Medicine

## 2013-08-13 ENCOUNTER — Ambulatory Visit (INDEPENDENT_AMBULATORY_CARE_PROVIDER_SITE_OTHER): Payer: Managed Care, Other (non HMO) | Admitting: Internal Medicine

## 2013-08-13 VITALS — BP 126/80 | HR 62 | Temp 97.6°F | Ht 72.0 in | Wt >= 6400 oz

## 2013-08-13 DIAGNOSIS — R0989 Other specified symptoms and signs involving the circulatory and respiratory systems: Secondary | ICD-10-CM

## 2013-08-13 DIAGNOSIS — R0600 Dyspnea, unspecified: Secondary | ICD-10-CM

## 2013-08-13 DIAGNOSIS — R0609 Other forms of dyspnea: Secondary | ICD-10-CM

## 2013-08-13 NOTE — Progress Notes (Signed)
   Subjective:    Patient ID: Jon Carson, male    DOB: 1966/04/27  MRN: [id number]  HPI  5 yowm never smoker with h/o allergies mostly nasal and then tendency for chest colds x 1996 and referred 08/13/2013 by Cyndi Bender to pulmonary clinic for eval of sob with singing in church onset Feb 2015 with abn pfts.   08/13/2013 1st [facility] office visit/ Anchor Dwan cc variable cough/chest congestion with uri's last one Dec 2014 on intermittent saba but started back on bid ventolin and seems to help since 1st of Feb when noted onset of sob with singing assoc with mild globus sensation/ dysphagia but no cough.  Assoc with 30 lb wt gain over baseline,  Takes prn prilosec qod on avg p bfast  No obvious other patterns in day to day or daytime variabilty or assoc ex sob or cp or chest tightness, subjective wheeze overt sinus or hb symptoms. No unusual exp hx or h/o childhood pna/ asthma or knowledge of premature birth.  Sleeping ok without nocturnal  or early am exacerbation  of respiratory  c/o's or need for noct saba. Also denies any obvious fluctuation of symptoms with weather or environmental changes or other aggravating or alleviating factors except as outlined above   Current Medications, Allergies, Complete Past Medical History, Past Surgical History, Family History, and Social History were reviewed in [employer] record.                Review of Systems  Constitutional: Negative for fever, chills, activity change, appetite change and unexpected weight change.  HENT: Positive for trouble swallowing. Negative for congestion, dental problem, postnasal drip, rhinorrhea, sneezing, sore throat and voice change.   Eyes: Negative for visual disturbance.  Respiratory: Positive for shortness of breath. Negative for cough and choking.   Cardiovascular: Negative for chest pain and leg swelling.  Gastrointestinal: Negative for nausea, vomiting and abdominal pain.   Genitourinary: Negative for difficulty urinating.       Heartburn  Musculoskeletal: Negative for arthralgias.  Skin: Negative for rash.  Psychiatric/Behavioral: Negative for behavioral problems and confusion.       Objective:   Physical Exam  amb wf mildly hoarseness  Wt Readings from Last 3 Encounters:  08/13/13 468 lb (212.283 kg)  09/20/11 449 lb (203.665 kg)  08/23/11 466 lb 15 oz (211.8 kg)     HEENT: nl dentition, turbinates, and orophanx. Nl external ear canals without cough reflex   NECK :  without JVD/Nodes/TM/ nl carotid upstrokes bilaterally   LUNGS: no acc muscle use, clear to A and P bilaterally without cough on insp or exp maneuvers   CV:  RRR  no s3 or murmur or increase in P2, no edema   ABD:  soft and nontender with nl excursion in the supine position. No bruits or organomegaly, bowel sounds nl  MS:  warm without deformities, calf tenderness, cyanosis or clubbing  SKIN: warm and dry without lesions    NEURO:  alert, approp, no deficits   pfts from [facility] not available, requested     Assessment & Plan:

## 2013-08-13 NOTE — Patient Instructions (Signed)
Plan A = automatic=  Prilosec Take 30- 60 min before your first and last meals of the day  GERD (REFLUX)  is an extremely common cause of respiratory symptoms, many times with no significant heartburn at all.    It can be treated with medication, but also with lifestyle changes including avoidance of late meals, excessive alcohol, smoking cessation, and avoid fatty foods, chocolate, peppermint, colas, red wine, and acidic juices such as orange juice.  NO MINT OR MENTHOL PRODUCTS SO NO COUGH DROPS  USE SUGARLESS CANDY INSTEAD (jolley ranchers or Stover's)  NO OIL BASED VITAMINS - use powdered substitutes.   Plan B = Back up = Only use your albuterol (ventolin) as a rescue medication to be used if you can't catch your breath by resting or doing a relaxed purse lip breathing pattern.  - The less you use it, the better it will work when you need it. - Ok to use up to 2 puffs  every 4 hours if you must but call for immediate appointment if use goes up over your usual need - Don't leave home without it !!  (think of it like the spare tire for your car)   Please schedule a follow up office visit in 4 weeks, sooner if needed with pfts

## 2013-08-15 DIAGNOSIS — R0600 Dyspnea, unspecified: Secondary | ICD-10-CM | POA: Insufficient documentation

## 2013-08-15 NOTE — Assessment & Plan Note (Signed)
Symptoms are markedly disproportionate to objective findings and not clear this is a lung problem but pt does appear to have difficult airway management issues. DDX of  difficult airways managment all start with A and  include Adherence, Ace Inhibitors, Acid Reflux, Active Sinus Disease, Alpha 1 Antitripsin deficiency, Anxiety masquerading as Airways dz,  ABPA,  allergy(esp in young), Aspiration (esp in elderly), Adverse effects of DPI,  Active smokers, plus two Bs  = Bronchiectasis and Beta blocker use..and one C= CHF  ? Acid (or non-acid) GERD > always difficult to exclude as up to 75% of pts in some series report no assoc GI/ Heartburn symptoms> rec max (24h)  acid suppression and diet restrictions/ reviewed and instructions given in writing.   ? Allergy/asthma > try just using the saba prn rather than bid  CHF > very unlikely s exertional sob, just notes when singing.

## 2013-08-18 ENCOUNTER — Encounter: Payer: Self-pay | Admitting: Internal Medicine

## 2013-09-17 ENCOUNTER — Ambulatory Visit (INDEPENDENT_AMBULATORY_CARE_PROVIDER_SITE_OTHER): Payer: Managed Care, Other (non HMO) | Admitting: Internal Medicine

## 2013-09-17 ENCOUNTER — Encounter: Payer: Self-pay | Admitting: Internal Medicine

## 2013-09-17 VITALS — BP 138/80 | HR 71 | Temp 97.8°F | Ht 72.5 in | Wt >= 6400 oz

## 2013-09-17 DIAGNOSIS — G4730 Sleep apnea, unspecified: Secondary | ICD-10-CM

## 2013-09-17 DIAGNOSIS — R0989 Other specified symptoms and signs involving the circulatory and respiratory systems: Secondary | ICD-10-CM

## 2013-09-17 DIAGNOSIS — R0600 Dyspnea, unspecified: Secondary | ICD-10-CM

## 2013-09-17 DIAGNOSIS — R0609 Other forms of dyspnea: Secondary | ICD-10-CM

## 2013-09-17 NOTE — Progress Notes (Signed)
PFT done today. 

## 2013-09-17 NOTE — Progress Notes (Signed)
Subjective:    Patient ID: Jon Carson, male    DOB: 1966/05/04  MRN: [id number]   Brief patient profile:  83 yowm never smoker with h/o allergies mostly nasal and then tendency for chest colds x 1996 and referred 08/13/2013 by Jon Carson to pulmonary clinic for eval of sob with singing in church onset Feb 2015 with abn pfts.  History of Present Illness  08/13/2013 1st [facility] office visit/ Jon Carson cc variable cough/chest congestion with uri's last one Dec 2014 on intermittent saba but started back on bid ventolin and seems to help since 1st of Feb when noted onset of sob with singing assoc with mild globus sensation/ dysphagia but no cough.  Assoc with 30 lb wt gain over baseline,  Takes prn prilosec qod on avg p bfast rec Plan A = automatic=  Prilosec Take 30- 60 min before your first and last meals of the day  GERD  . 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 Follow-up    With PFT results. Pt states his breathing has improved since last OV. Pt states he caught a cold in March and was given abx, pt now feels much better. SOB with moderate activity. Denies CP and cough.    Used saba with uri/ now off completely  Needs new bipap mask.   No obvious day to day or daytime variabilty or assoc chronic cough or cp or chest tightness, subjective wheeze overt sinus or hb symptoms. No unusual exp hx or h/o childhood pna/ asthma or knowledge of premature birth.  Sleeping ok without nocturnal  or early am exacerbation  of respiratory  c/o's or need for noct saba. Also denies any obvious fluctuation of symptoms with weather or environmental changes or other aggravating or alleviating factors except as outlined above   Current Medications, Allergies, Complete Past Medical History, Past Surgical History, Family  History, and Social History were reviewed in [employer] record.  ROS  The following are not active complaints unless bolded sore throat, dysphagia, dental problems, itching, sneezing,  nasal congestion or excess/ purulent secretions, ear ache,   fever, chills, sweats, unintended wt loss, pleuritic or exertional cp, hemoptysis,  orthopnea pnd or leg swelling, presyncope, palpitations, heartburn, abdominal pain, anorexia, nausea, vomiting, diarrhea  or change in bowel or urinary habits, change in stools or urine, dysuria,hematuria,  rash, arthralgias, visual complaints, headache, numbness weakness or ataxia or problems with walking or coordination,  change in mood/affect or memory.                              Objective:   Physical Exam  amb wm nad   09/17/2013       468  Wt Readings from Last 3 Encounters:  08/13/13 468 lb (212.283 kg)  09/20/11 449 lb (203.665 kg)  08/23/11 466 lb 15 oz (211.8 kg)     HEENT: nl dentition, turbinates, and orophanx. Nl external ear canals without cough reflex   NECK :  without JVD/Nodes/TM/ nl carotid upstrokes bilaterally   LUNGS: no acc muscle use, clear to A and P bilaterally without cough on insp or exp maneuvers   CV:  RRR  no s3 or murmur or increase in P2, no edema  ABD:  Massively obese but soft and nontender with nl excursion in the supine position. No bruits or organomegaly, bowel sounds nl  MS:  warm without deformities, calf tenderness, cyanosis or clubbing  SKIN: warm and dry without lesions  X mild venous stasis changes both LE's  NEURO:  alert, approp, no deficits         Assessment & Plan:

## 2013-09-17 NOTE — Assessment & Plan Note (Addendum)
-   pfts 07/17/13 FEV1  4.04(86%) ratio 85 with flattened insp loop - PFTs on gerd rx >  09/17/2013 no insp truncation/ symptoms better, no airflow obst but frc reduced typical of the effects of obesity   He may have tendency to mild acute asthma with uri's and needs to keep the saba on hand but no need for maint rx except for GERD rx and wt loss. Pulmonary f/u prn

## 2013-09-17 NOTE — Patient Instructions (Addendum)
Weight control is simply a matter of calorie balance which needs to be tilted in your favor by eating less and exercising more.  To get the most out of exercise, you need to be continuously aware that you are short of breath, but never out of breath, for 30 minutes daily. As you improve, it will actually be easier for you to do the same amount of exercise  in  30 minutes so always push to the level where you are short of breath.  If this does not result in gradual weight reduction then I strongly recommend you see a nutritionist with a food diary x 2 weeks so that we can work out a negative calorie balance which is universally effective in steady weight loss programs.  Think of your calorie balance like you do your bank account where in this case you want the balance to go down so you must take in less calories than you burn up.  It's just that simple:  Hard to do, but easy to understand.  Good luck!   Follow up >  Refer to sleep doctor here with a download from your machine

## 2013-09-18 NOTE — Assessment & Plan Note (Signed)
Initial eval by Chodri > bipap/ problems with mask choice > requesting second opinion > referred to sleep medicine

## 2013-09-30 LAB — PULMONARY FUNCTION TEST
DL/VA % pred: 141 %
DL/VA: 6.78 ml/min/mmHg/L
DLCO unc % pred: 102 %
DLCO unc: 36.53 ml/min/mmHg
FEF 25-75 Post: 4.93 L/sec
FEF 25-75 Pre: 4.55 L/sec
FEF2575-%Change-Post: 8 %
FEF2575-%Pred-Post: 128 %
FEF2575-%Pred-Pre: 118 %
FEV1-%Change-Post: 4 %
FEV1-%Pred-Post: 78 %
FEV1-%Pred-Pre: 74 %
FEV1-Post: 3.39 L
FEV1-Pre: 3.25 L
FEV1FVC-%Change-Post: 1 %
FEV1FVC-%Pred-Pre: 111 %
FEV6-%Change-Post: 2 %
FEV6-%Pred-Post: 71 %
FEV6-%Pred-Pre: 69 %
FEV6-Post: 3.83 L
FEV6-Pre: 3.74 L
FEV6FVC-%Change-Post: 0 %
FEV6FVC-%Pred-Post: 103 %
FEV6FVC-%Pred-Pre: 102 %
FVC-%Change-Post: 2 %
FVC-%Pred-Post: 69 %
FVC-%Pred-Pre: 67 %
FVC-Post: 3.84 L
FVC-Pre: 3.74 L
Post FEV1/FVC ratio: 88 %
Post FEV6/FVC ratio: 100 %
Pre FEV1/FVC ratio: 87 %
Pre FEV6/FVC Ratio: 100 %

## 2014-02-24 ENCOUNTER — Emergency Department (HOSPITAL_COMMUNITY): Payer: Managed Care, Other (non HMO)

## 2014-02-24 ENCOUNTER — Emergency Department (HOSPITAL_COMMUNITY)
Admission: EM | Admit: 2014-02-24 | Discharge: 2014-02-25 | Disposition: A | Discharge status: Home / Self Care | Payer: Managed Care, Other (non HMO) | Attending: Emergency Medicine | Admitting: Emergency Medicine

## 2014-02-24 ENCOUNTER — Encounter (HOSPITAL_COMMUNITY): Payer: Self-pay | Admitting: Emergency Medicine

## 2014-02-24 DIAGNOSIS — M12869 Other specific arthropathies, not elsewhere classified, unspecified knee: Secondary | ICD-10-CM | POA: Diagnosis not present

## 2014-02-24 DIAGNOSIS — R1084 Generalized abdominal pain: Secondary | ICD-10-CM | POA: Diagnosis present

## 2014-02-24 DIAGNOSIS — I10 Essential (primary) hypertension: Secondary | ICD-10-CM | POA: Insufficient documentation

## 2014-02-24 DIAGNOSIS — E039 Hypothyroidism, unspecified: Secondary | ICD-10-CM | POA: Insufficient documentation

## 2014-02-24 DIAGNOSIS — G4730 Sleep apnea, unspecified: Secondary | ICD-10-CM | POA: Insufficient documentation

## 2014-02-24 DIAGNOSIS — K5792 Diverticulitis of intestine, part unspecified, without perforation or abscess without bleeding: Secondary | ICD-10-CM

## 2014-02-24 DIAGNOSIS — Z9981 Dependence on supplemental oxygen: Secondary | ICD-10-CM | POA: Insufficient documentation

## 2014-02-24 DIAGNOSIS — Z79899 Other long term (current) drug therapy: Secondary | ICD-10-CM | POA: Insufficient documentation

## 2014-02-24 DIAGNOSIS — Z87448 Personal history of other diseases of urinary system: Secondary | ICD-10-CM | POA: Diagnosis not present

## 2014-02-24 DIAGNOSIS — K5732 Diverticulitis of large intestine without perforation or abscess without bleeding: Secondary | ICD-10-CM | POA: Diagnosis not present

## 2014-02-24 DIAGNOSIS — Z791 Long term (current) use of non-steroidal anti-inflammatories (NSAID): Secondary | ICD-10-CM | POA: Insufficient documentation

## 2014-02-24 DIAGNOSIS — K219 Gastro-esophageal reflux disease without esophagitis: Secondary | ICD-10-CM | POA: Insufficient documentation

## 2014-02-24 LAB — LIPASE, BLOOD: Lipase: 27 U/L (ref 11–59)

## 2014-02-24 LAB — COMPREHENSIVE METABOLIC PANEL
ALT: 17 U/L (ref 0–53)
AST: 16 U/L (ref 0–37)
Albumin: 4 g/dL (ref 3.5–5.2)
Alkaline Phosphatase: 92 U/L (ref 39–117)
Anion gap: 16 — ABNORMAL HIGH (ref 5–15)
BUN: 21 mg/dL (ref 6–23)
CO2: 25 mEq/L (ref 19–32)
Calcium: 9 mg/dL (ref 8.4–10.5)
Chloride: 96 mEq/L (ref 96–112)
Creatinine, Ser: 1.14 mg/dL (ref 0.50–1.35)
GFR calc Af Amer: 87 mL/min — ABNORMAL LOW (ref >90)
GFR calc non Af Amer: 75 mL/min — ABNORMAL LOW (ref >90)
Glucose, Bld: 107 mg/dL — ABNORMAL HIGH (ref 70–99)
Potassium: 3.4 mEq/L — ABNORMAL LOW (ref 3.7–5.3)
Sodium: 137 mEq/L (ref 137–147)
Total Bilirubin: 0.8 mg/dL (ref 0.3–1.2)
Total Protein: 8.2 g/dL (ref 6.0–8.3)

## 2014-02-24 LAB — CBC WITH DIFFERENTIAL/PLATELET
Basophils Absolute: 0 10*3/uL (ref 0.0–0.1)
Basophils Relative: 0 % (ref 0–1)
Eosinophils Absolute: 0.2 10*3/uL (ref 0.0–0.7)
Eosinophils Relative: 1 % (ref 0–5)
HCT: 47.4 % (ref 39.0–52.0)
Hemoglobin: 16.1 g/dL (ref 13.0–17.0)
Lymphocytes Relative: 17 % (ref 12–46)
Lymphs Abs: 3 10*3/uL (ref 0.7–4.0)
MCH: 31.2 pg (ref 26.0–34.0)
MCHC: 34 g/dL (ref 30.0–36.0)
MCV: 91.9 fL (ref 78.0–100.0)
Monocytes Absolute: 1.2 10*3/uL — ABNORMAL HIGH (ref 0.1–1.0)
Monocytes Relative: 7 % (ref 3–12)
Neutro Abs: 12.7 10*3/uL — ABNORMAL HIGH (ref 1.7–7.7)
Neutrophils Relative %: 75 % (ref 43–77)
Platelets: 311 10*3/uL (ref 150–400)
RBC: 5.16 MIL/uL (ref 4.22–5.81)
RDW: 13.1 % (ref 11.5–15.5)
WBC: 17 10*3/uL — ABNORMAL HIGH (ref 4.0–10.5)

## 2014-02-24 LAB — URINALYSIS, ROUTINE W REFLEX MICROSCOPIC
Bilirubin Urine: NEGATIVE
Glucose, UA: NEGATIVE mg/dL
Hgb urine dipstick: NEGATIVE
Ketones, ur: NEGATIVE mg/dL
Leukocytes, UA: NEGATIVE
Nitrite: NEGATIVE
Protein, ur: NEGATIVE mg/dL
Specific Gravity, Urine: 1.023 (ref 1.005–1.030)
Urobilinogen, UA: 0.2 mg/dL (ref 0.0–1.0)
pH: 6 (ref 5.0–8.0)

## 2014-02-24 MED ORDER — IOHEXOL 300 MG/ML  SOLN
50.0000 mL | Freq: Once | INTRAMUSCULAR | Status: AC | PRN
  Administered 2014-02-24: 50 mL via ORAL

## 2014-02-24 NOTE — ED Provider Notes (Signed)
CSN: [id number]     Arrival date & time 02/24/14  1956 History   First MD Initiated Contact with Patient 02/24/14 2254     Chief Complaint  Patient presents with  . Abdominal Pain     (Consider location/radiation/quality/duration/timing/severity/associated sxs/prior Treatment) The history is provided by the patient. No language interpreter was used.  Jon Carson is a 48 y/o M with PMHx GERD, diverticulosis, HTN, sleep apnea, HTN presenting to the ED with abdominal pain that started at approximately 10:00AM this morning. Patient reported that the pain is localized to the lower portions of the abdomen that is a constant pain with radiation to the entire abdomen. Patient reported that he has been feeling nauseous and light-headed today. Reported that he ate spicy food yesterday and had Pad [place] today. Stated that this pain feels similar to when he had a perforated bowel 10 years ago. Denied fever, chest pain, shortness of breath, difficulty breathing, vomiting, diarrhea, hematuria, melena, hematochezia. PCP Dr. Tobie Lords  Past Medical History  Diagnosis Date  . GERD (gastroesophageal reflux disease)   . Diverticulosis   . Obesities, morbid   . Edema of both legs   . Potassium serum increased     K+ WAS 5.3 IN ER AT [facility] ON 08/19/11--PT STATES ER PHYSICIAN TOOK HIM OFF LISINOPRIL AND PUT HIM ON HCTZ  AND TOLD PT TO FOLLOW UP WITH HIS MEDICAL DOCTOR  . Shortness of breath     WITH PAIN AND EXERTION  . Hypothyroidism   . Renal disorder     CYST ON KIDNEY AND STONE IN LEFT KIDNEY AND STONE IN LEFT URETER  . Arthritis     KNEES " REALLY BAD"  . Sleep apnea     USES CPAP EVERY NIGHT-WILL BRING MAS  . Jaw pain     LEFT - WENT TO DENTIST--TOLD XRAYS SHOWED NO CARTILAGE--HAS BEEN USING NSAID'S  AND USING OTC BITE GUARD AT NIGHT  . Hypertension   . Urethral stricture   . Self-catheterizes urinary bladder    Past Surgical History  Procedure Laterality Date  . Tonsillectomy    .  Urethrotomy      x3  . Hernia repair    . Shoulder arthroscopy      LEFT  . Stapedes surgery      X 2  . Cholecystectomy    . Knee arthroscopy      Bilateral  . Cystoscopy w/ ureteral stent placement  08/23/2011    Procedure: CYSTOSCOPY WITH RETROGRADE PYELOGRAM/URETERAL STENT PLACEMENT;  Surgeon: Dutch Gray, MD;  Location: [facility];  Service: Urology;  Laterality: Left;  . Cystoscopy w/ ureteral stent placement  09/22/2011    Procedure: CYSTOSCOPY WITH RETROGRADE PYELOGRAM/URETERAL STENT PLACEMENT;  Surgeon: Dutch Gray, MD;  Location: [facility];  Service: Urology;  Laterality: Left;  CYSTOSCOPY/LEFT RETROGRADE PYLEOGRAM/LEFT DIGITAL URETEROSCOPY/ LASER LITHOTRIPSY, LEFT URETERAL STENT PLACEMENT   Family History  Problem Relation Age of Onset  . Diabetes Father   . Emphysema Maternal Grandfather     smoked  . Colon cancer Maternal Grandfather   . Stroke Father    History  Substance Use Topics  . Smoking status: Never Smoker   . Smokeless tobacco: Never Used  . Alcohol Use: No    Review of Systems  Constitutional: Negative for fever and chills.  Respiratory: Negative for chest tightness and shortness of breath.   Cardiovascular: Negative for chest pain.  Gastrointestinal: Positive for nausea and abdominal pain. Negative for vomiting, diarrhea, constipation, blood in  stool and anal bleeding.  Genitourinary: Negative for dysuria.  Musculoskeletal: Negative for back pain and neck pain.  Neurological: Negative for dizziness, weakness and headaches.      Allergies  Raspberry and Percocet  Home Medications   Prior to Admission medications   Medication Sig Start Date End Date Taking? Authorizing Provider  levothyroxine (SYNTHROID, LEVOTHROID) 25 MCG tablet Take 25 mcg by mouth See admin instructions. Pt takes a total of 325mg  synthroid   Yes Historical Provider, MD  levothyroxine (SYNTHROID, LEVOTHROID) 300 MCG tablet Take 300 mcg by mouth See admin instructions.    Yes Historical  Provider, MD  loratadine (CLARITIN) 10 MG tablet Take 10 mg by mouth daily as needed. Allergies   Yes Historical Provider, MD  meloxicam (MOBIC) 15 MG tablet Take 15 mg by mouth every morning.    Yes Historical Provider, MD  naproxen sodium (ANAPROX) 220 MG tablet Take 220 mg by mouth 2 (two) times daily with a meal.   Yes Historical Provider, MD  omeprazole (PRILOSEC) 20 MG capsule Take 20 mg by mouth daily as needed. Heart burn   Yes Historical Provider, MD  triamterene-hydrochlorothiazide (DYAZIDE) 37.5-25 MG per capsule Take 1 capsule by mouth every morning.   Yes Historical Provider, MD   BP 143/71  Pulse 78  Temp(Src) 98.1 F (36.7 C) (Oral)  Resp 22  Ht 6\' 1"  (1.854 m)  Wt 453 lb (205.479 kg)  BMI 59.78 kg/m2  SpO2 98% Physical Exam  Nursing note and vitals reviewed. Constitutional: He is oriented to person, place, and time. He appears well-developed and well-nourished. No distress.  HENT:  Head: Normocephalic and atraumatic.  Mouth/Throat: Oropharynx is clear and moist. No oropharyngeal exudate.  Eyes: Conjunctivae and EOM are normal. Pupils are equal, round, and reactive to light. Right eye exhibits no discharge. Left eye exhibits no discharge.  Neck: Normal range of motion. Neck supple. No tracheal deviation present.  Cardiovascular: Normal rate, regular rhythm and normal heart sounds.  Exam reveals no friction rub.   No murmur heard. Pulses:      Radial pulses are 2+ on the right side, and 2+ on the left side.       Dorsalis pedis pulses are 2+ on the right side, and 2+ on the left side.  Pulmonary/Chest: Effort normal and breath sounds normal. No respiratory distress. He has no wheezes. He has no rales.  Abdominal: Soft. Bowel sounds are normal. There is tenderness. There is no rebound and no guarding.  Obese Bowel sounds normoactive in all 4 quadrants Abdomen soft upon palpation Generalized pain upon palpation to the abdomen-when epigastric region is palpated patient  experiences pain in the lower quadrants Voluntary guarding upon palpation to the abdomen  Musculoskeletal: Normal range of motion.  Full ROM to upper and lower extremities without difficulty noted, negative ataxia noted.  Lymphadenopathy:    He has no cervical adenopathy.  Neurological: He is alert and oriented to person, place, and time. No cranial nerve deficit. He exhibits normal muscle tone. Coordination normal.  Skin: Skin is warm and dry. No rash noted. He is not diaphoretic. No erythema.  Psychiatric: He has a normal mood and affect. His behavior is normal. Thought content normal.    ED Course  Procedures (including critical care time)  Results for orders placed during the hospital encounter of 02/24/14  CBC WITH DIFFERENTIAL      Result Value Ref Range   WBC 17.0 (*) 4.0 - 10.5 K/uL   RBC 5.16  4.22 - 5.81 MIL/uL   Hemoglobin 16.1  13.0 - 17.0 g/dL   HCT 47.4  39.0 - 52.0 %   MCV 91.9  78.0 - 100.0 fL   MCH 31.2  26.0 - 34.0 pg   MCHC 34.0  30.0 - 36.0 g/dL   RDW 13.1  11.5 - 15.5 %   Platelets 311  150 - 400 K/uL   Neutrophils Relative % 75  43 - 77 %   Neutro Abs 12.7 (*) 1.7 - 7.7 K/uL   Lymphocytes Relative 17  12 - 46 %   Lymphs Abs 3.0  0.7 - 4.0 K/uL   Monocytes Relative 7  3 - 12 %   Monocytes Absolute 1.2 (*) 0.1 - 1.0 K/uL   Eosinophils Relative 1  0 - 5 %   Eosinophils Absolute 0.2  0.0 - 0.7 K/uL   Basophils Relative 0  0 - 1 %   Basophils Absolute 0.0  0.0 - 0.1 K/uL  COMPREHENSIVE METABOLIC PANEL      Result Value Ref Range   Sodium 137  137 - 147 mEq/L   Potassium 3.4 (*) 3.7 - 5.3 mEq/L   Chloride 96  96 - 112 mEq/L   CO2 25  19 - 32 mEq/L   Glucose, Bld 107 (*) 70 - 99 mg/dL   BUN 21  6 - 23 mg/dL   Creatinine, Ser 1.14  0.50 - 1.35 mg/dL   Calcium 9.0  8.4 - 10.5 mg/dL   Total Protein 8.2  6.0 - 8.3 g/dL   Albumin 4.0  3.5 - 5.2 g/dL   AST 16  0 - 37 U/L   ALT 17  0 - 53 U/L   Alkaline Phosphatase 92  39 - 117 U/L   Total Bilirubin 0.8  0.3  - 1.2 mg/dL   GFR calc non Af Amer 75 (*) >90 mL/min   GFR calc Af Amer 87 (*) >90 mL/min   Anion gap 16 (*) 5 - 15  LIPASE, BLOOD      Result Value Ref Range   Lipase 27  11 - 59 U/L  URINALYSIS, ROUTINE W REFLEX MICROSCOPIC      Result Value Ref Range   Color, Urine YELLOW  YELLOW   APPearance CLEAR  CLEAR   Specific Gravity, Urine 1.023  1.005 - 1.030   pH 6.0  5.0 - 8.0   Glucose, UA NEGATIVE  NEGATIVE mg/dL   Hgb urine dipstick NEGATIVE  NEGATIVE   Bilirubin Urine NEGATIVE  NEGATIVE   Ketones, ur NEGATIVE  NEGATIVE mg/dL   Protein, ur NEGATIVE  NEGATIVE mg/dL   Urobilinogen, UA 0.2  0.0 - 1.0 mg/dL   Nitrite NEGATIVE  NEGATIVE   Leukocytes, UA NEGATIVE  NEGATIVE    Labs Review Labs Reviewed  CBC WITH DIFFERENTIAL - Abnormal; Notable for the following:    WBC 17.0 (*)    Neutro Abs 12.7 (*)    Monocytes Absolute 1.2 (*)    All other components within normal limits  COMPREHENSIVE METABOLIC PANEL - Abnormal; Notable for the following:    Potassium 3.4 (*)    Glucose, Bld 107 (*)    GFR calc non Af Amer 75 (*)    GFR calc Af Amer 87 (*)    Anion gap 16 (*)    All other components within normal limits  LIPASE, BLOOD  URINALYSIS, ROUTINE W REFLEX MICROSCOPIC    Imaging Review No results found.   EKG Interpretation None  MDM   Final diagnoses:  None    Medications  ondansetron (ZOFRAN-ODT) disintegrating tablet 4 mg (not administered)  iohexol (OMNIPAQUE) 300 MG/ML solution 50 mL (50 mLs Oral Contrast Given 02/24/14 2354)  sodium chloride 0.9 % bolus 1,000 mL (1,000 mLs Intravenous New Bag/Given 02/25/14 0028)  morphine 4 MG/ML injection 4 mg (4 mg Intravenous Given 02/25/14 0040)  ondansetron (ZOFRAN) 4 MG/2ML injection (4 mg  Given 02/25/14 0045)  iohexol (OMNIPAQUE) 300 MG/ML solution 100 mL (100 mLs Intravenous Contrast Given 02/25/14 0115)   Filed Vitals:   02/24/14 2017 02/24/14 2341  BP: 168/139 143/71  Pulse: 102 78  Temp: 98.1 F (36.7 C)    TempSrc: Oral   Resp: 20 22  Height: 6\' 1"  (1.854 m)   Weight: 453 lb (205.479 kg)   SpO2: 100% 98%   CBC noted elevated white blood cell count 17.0 with a neutrophil count 12.7. CMP noted mildly low potassium 3.4. Glucose 107 with anion gap of 16.0 mEq per liter. BUN 21, gram 1.14. Lipase negative elevation. Urinalysis unremarkable. Leukocytosis noted with history of diverticulosis and perforation of the bowels 10 years ago. CT abdomen and pelvis with contrast ordered and pending. Discussed case with Antonietta Breach, PA-C - transfer of care to [facility] PA-C at change in shift.   Jamse Mead, PA-C 02/25/14 [phone]

## 2014-02-24 NOTE — ED Notes (Signed)
Pt states he has lower abdominal pain similar to when he had a perforated colon 10 years ago. Started at 10 am. Alert and oriented. Nausea.

## 2014-02-25 ENCOUNTER — Emergency Department (HOSPITAL_COMMUNITY): Payer: Managed Care, Other (non HMO)

## 2014-02-25 ENCOUNTER — Encounter (HOSPITAL_COMMUNITY): Payer: Self-pay

## 2014-02-25 MED ORDER — MORPHINE SULFATE 4 MG/ML IJ SOLN
4.0000 mg | Freq: Once | INTRAMUSCULAR | Status: AC
  Administered 2014-02-25: 4 mg via INTRAVENOUS
  Filled 2014-02-25: qty 1

## 2014-02-25 MED ORDER — METRONIDAZOLE 500 MG PO TABS: 500.0000 mg | ORAL_TABLET | Freq: Three times a day (TID) | ORAL | Status: DC

## 2014-02-25 MED ORDER — OXYCODONE-ACETAMINOPHEN 5-325 MG PO TABS
2.0000 | ORAL_TABLET | Freq: Once | ORAL | Status: AC
  Administered 2014-02-25: 2 via ORAL
  Filled 2014-02-25: qty 2

## 2014-02-25 MED ORDER — DOCUSATE SODIUM 100 MG PO CAPS: 100.0000 mg | ORAL_CAPSULE | Freq: Two times a day (BID) | ORAL | Status: DC | PRN

## 2014-02-25 MED ORDER — ONDANSETRON HCL 4 MG PO TABS: 4.0000 mg | ORAL_TABLET | Freq: Four times a day (QID) | ORAL | Status: DC

## 2014-02-25 MED ORDER — MORPHINE SULFATE 4 MG/ML IJ SOLN
2.0000 mg | Freq: Once | INTRAMUSCULAR | Status: AC
  Administered 2014-02-25: 2 mg via INTRAVENOUS
  Filled 2014-02-25: qty 1

## 2014-02-25 MED ORDER — CIPROFLOXACIN HCL 500 MG PO TABS: 500.0000 mg | ORAL_TABLET | Freq: Two times a day (BID) | ORAL | Status: DC

## 2014-02-25 MED ORDER — SODIUM CHLORIDE 0.9 % IV BOLUS (SEPSIS)
1000.0000 mL | Freq: Once | INTRAVENOUS | Status: AC
  Administered 2014-02-25: 1000 mL via INTRAVENOUS

## 2014-02-25 MED ORDER — ONDANSETRON 4 MG PO TBDP: 4.0000 mg | ORAL_TABLET | Freq: Once | ORAL | Status: DC

## 2014-02-25 MED ORDER — IOHEXOL 300 MG/ML  SOLN
100.0000 mL | Freq: Once | INTRAMUSCULAR | Status: AC | PRN
  Administered 2014-02-25: 100 mL via INTRAVENOUS

## 2014-02-25 MED ORDER — METRONIDAZOLE 500 MG PO TABS
500.0000 mg | ORAL_TABLET | Freq: Once | ORAL | Status: AC
  Administered 2014-02-25: 500 mg via ORAL
  Filled 2014-02-25: qty 1

## 2014-02-25 MED ORDER — OXYCODONE-ACETAMINOPHEN 5-325 MG PO TABS: 1.0000 | ORAL_TABLET | Freq: Four times a day (QID) | ORAL | Status: DC | PRN

## 2014-02-25 MED ORDER — ONDANSETRON HCL 4 MG/2ML IJ SOLN
INTRAMUSCULAR | Status: AC
  Administered 2014-02-25: 4 mg
  Filled 2014-02-25: qty 2

## 2014-02-25 MED ORDER — CIPROFLOXACIN HCL 500 MG PO TABS
500.0000 mg | ORAL_TABLET | Freq: Once | ORAL | Status: AC
  Administered 2014-02-25: 500 mg via ORAL
  Filled 2014-02-25: qty 1

## 2014-02-25 MED ORDER — MORPHINE SULFATE 4 MG/ML IJ SOLN
4.0000 mg | Freq: Once | INTRAMUSCULAR | Status: AC | PRN
  Administered 2014-02-25: 4 mg via INTRAVENOUS
  Filled 2014-02-25: qty 1

## 2014-02-25 MED ORDER — ONDANSETRON HCL 4 MG/2ML IJ SOLN
4.0000 mg | INTRAMUSCULAR | Status: AC
  Administered 2014-02-25: 4 mg via INTRAVENOUS
  Filled 2014-02-25: qty 2

## 2014-02-25 NOTE — ED Notes (Signed)
Pt reports mid abd pain which started yesterday am with nausea.  Pt reports ate lunch and made it worse.  Pt reports his BP is elevated d/t pain.  Pt reports hx of diverticulosis/diverticulitis.

## 2014-02-25 NOTE — ED Provider Notes (Signed)
0315 - Patient care assumed from [facility], PA-C at shift change. Patient presenting with LLQ pain. Work up significant for leukocytosis. CT scan shows uncomplicated diverticulitis. Patient with hx of same. Patient pending fluid challenge and pain control at sign out.  I had a very lengthy discussion with the patient regarding management options. I discussed inpatient management should patient not achieve pain control or ability to tolerate fluids orally. I also discussed outpatient management should patient's pain be well-controlled and he is able to tolerate fluids. Patient states he has moderate improvement in symptoms with morphine. He states his pain has improved from 10/10 to 4/10 at this time. He denies a hx of vomiting and states Zofran has greatly improved his nausea. He has been able to tolerate fluids in the Emergency Department without emesis.  Following my extensive discussion with the patient, the patient states he feels comfortable managing his symptoms further at home and would prefer this. His treatment has been initiated with Cipro and Flagyl in the emergency department. Have discussed outpatient pain management with Percocet as well as Colace as he endorses constipation from narcotics. Patient will be prescribed Zofran for persistent nausea to ensure that he can maintain a clear liquid diet. Also given information on diverticulitis and a clear liquid diet, specifically.  Return precautions discussed and provided. Patient and wife agreeable to plan with no unaddressed concerns. Patient discharged in good condition; VSS.   Filed Vitals:   02/24/14 2017 02/24/14 2341 02/25/14 0201 02/25/14 0417  BP: 168/139 143/71 151/69 138/82  Pulse: 102 78 78 84  Temp: 98.1 F (36.7 C)  98.6 F (37 C) 98.8 F (37.1 C)  TempSrc: Oral  Oral Oral  Resp: 20 22 22    Height: 6\' 1"  (1.854 m)     Weight: 453 lb (205.479 kg)     SpO2: 100% 98% 98% 99%   Results for orders placed during the  hospital encounter of 02/24/14  CBC WITH DIFFERENTIAL      Result Value Ref Range   WBC 17.0 (*) 4.0 - 10.5 K/uL   RBC 5.16  4.22 - 5.81 MIL/uL   Hemoglobin 16.1  13.0 - 17.0 g/dL   HCT 47.4  39.0 - 52.0 %   MCV 91.9  78.0 - 100.0 fL   MCH 31.2  26.0 - 34.0 pg   MCHC 34.0  30.0 - 36.0 g/dL   RDW 13.1  11.5 - 15.5 %   Platelets 311  150 - 400 K/uL   Neutrophils Relative % 75  43 - 77 %   Neutro Abs 12.7 (*) 1.7 - 7.7 K/uL   Lymphocytes Relative 17  12 - 46 %   Lymphs Abs 3.0  0.7 - 4.0 K/uL   Monocytes Relative 7  3 - 12 %   Monocytes Absolute 1.2 (*) 0.1 - 1.0 K/uL   Eosinophils Relative 1  0 - 5 %   Eosinophils Absolute 0.2  0.0 - 0.7 K/uL   Basophils Relative 0  0 - 1 %   Basophils Absolute 0.0  0.0 - 0.1 K/uL  COMPREHENSIVE METABOLIC PANEL      Result Value Ref Range   Sodium 137  137 - 147 mEq/L   Potassium 3.4 (*) 3.7 - 5.3 mEq/L   Chloride 96  96 - 112 mEq/L   CO2 25  19 - 32 mEq/L   Glucose, Bld 107 (*) 70 - 99 mg/dL   BUN 21  6 - 23 mg/dL   Creatinine, Ser  1.14  0.50 - 1.35 mg/dL   Calcium 9.0  8.4 - 10.5 mg/dL   Total Protein 8.2  6.0 - 8.3 g/dL   Albumin 4.0  3.5 - 5.2 g/dL   AST 16  0 - 37 U/L   ALT 17  0 - 53 U/L   Alkaline Phosphatase 92  39 - 117 U/L   Total Bilirubin 0.8  0.3 - 1.2 mg/dL   GFR calc non Af Amer 75 (*) >90 mL/min   GFR calc Af Amer 87 (*) >90 mL/min   Anion gap 16 (*) 5 - 15  LIPASE, BLOOD      Result Value Ref Range   Lipase 27  11 - 59 U/L  URINALYSIS, ROUTINE W REFLEX MICROSCOPIC      Result Value Ref Range   Color, Urine YELLOW  YELLOW   APPearance CLEAR  CLEAR   Specific Gravity, Urine 1.023  1.005 - 1.030   pH 6.0  5.0 - 8.0   Glucose, UA NEGATIVE  NEGATIVE mg/dL   Hgb urine dipstick NEGATIVE  NEGATIVE   Bilirubin Urine NEGATIVE  NEGATIVE   Ketones, ur NEGATIVE  NEGATIVE mg/dL   Protein, ur NEGATIVE  NEGATIVE mg/dL   Urobilinogen, UA 0.2  0.0 - 1.0 mg/dL   Nitrite NEGATIVE  NEGATIVE   Leukocytes, UA NEGATIVE  NEGATIVE   Ct  Abdomen Pelvis W Contrast  02/25/2014   CLINICAL DATA:  Mid to lower abdominal pain with nausea and elevated white blood count  EXAM: CT ABDOMEN AND PELVIS WITH CONTRAST  TECHNIQUE: Multidetector CT imaging of the abdomen and pelvis was performed using the standard protocol following bolus administration of intravenous contrast.  CONTRAST:  131mL OMNIPAQUE IOHEXOL 300 MG/ML  SOLN  COMPARISON:  08/19/2011  FINDINGS: Visualized lung bases clear.  No acute musculoskeletal findings.  Liver normal. Gallbladder is surgically absent. Spleen and pancreas normal. Adrenal glands normal. Left kidney normal. Right kidney demonstrates a 1 cm low-attenuation lesion in the lower pole. This is too small to characterize but it does not appear to enhance. This likely a cyst.  Aorta is normal.  Bladder is normal.  No ascites.  Small bowel and appendix are normal. There is diverticulosis of the mid to distal sigmoid colon. There is mild inflammatory change surrounding this portion of the colon. There is no evidence of abscess or perforation.  IMPRESSION: Diverticulitis.  1 cm lesion lower pole right kidney likely a cyst but not definitively characterized.   Electronically Signed   By: Skipper Cliche M.D.   On: 02/25/2014 01:55      Antonietta Breach, PA-C 02/25/14 [phone]

## 2014-02-25 NOTE — ED Notes (Signed)
Patient transported to CT 

## 2014-02-25 NOTE — ED Provider Notes (Signed)
Medical screening examination/treatment/procedure(s) were performed by non-physician practitioner and as supervising physician I was immediately available for consultation/collaboration.   EKG Interpretation None        Merryl Hacker, MD 02/25/14 [phone]

## 2014-02-25 NOTE — ED Provider Notes (Signed)
Medical screening examination/treatment/procedure(s) were performed by non-physician practitioner and as supervising physician I was immediately available for consultation/collaboration.   EKG Interpretation None        Merryl Hacker, MD 02/25/14 [phone]

## 2014-02-25 NOTE — Discharge Instructions (Signed)
Take Cipro and Flagyl as prescribed. Recommend you take Percocet for pain control and Zofran for nausea. You may take Colace as needed for constipation. Begin a clear liquid diet to prevent worsening of your symptoms. In the future avoid seeds, nuts, and popcorn as they may cause a diverticulitis flare. Follow up with your doctor to ensure resolution of symptoms.  Diverticulitis Diverticulitis is inflammation or infection of small pouches in your colon that form when you have a condition called diverticulosis. The pouches in your colon are called diverticula. Your colon, or large intestine, is where water is absorbed and stool is formed. Complications of diverticulitis can include:  Bleeding.  Severe infection.  Severe pain.  Perforation of your colon.  Obstruction of your colon. CAUSES  Diverticulitis is caused by bacteria. Diverticulitis happens when stool becomes trapped in diverticula. This allows bacteria to grow in the diverticula, which can lead to inflammation and infection. RISK FACTORS People with diverticulosis are at risk for diverticulitis. Eating a diet that does not include enough fiber from fruits and vegetables may make diverticulitis more likely to develop. SYMPTOMS  Symptoms of diverticulitis may include:  Abdominal pain and tenderness. The pain is normally located on the left side of the abdomen, but may occur in other areas.  Fever and chills.  Bloating.  Cramping.  Nausea.  Vomiting.  Constipation.  Diarrhea.  Blood in your stool. DIAGNOSIS  Your health care provider will ask you about your medical history and do a physical exam. You may need to have tests done because many medical conditions can cause the same symptoms as diverticulitis. Tests may include:  Blood tests.  Urine tests.  Imaging tests of the abdomen, including X-rays and CT scans. When your condition is under control, your health care provider may recommend that you have a  colonoscopy. A colonoscopy can show how severe your diverticula are and whether something else is causing your symptoms. TREATMENT  Most cases of diverticulitis are mild and can be treated at home. Treatment may include:  Taking over-the-counter pain medicines.  Following a clear liquid diet.  Taking antibiotic medicines by mouth for 7-10 days. More severe cases may be treated at a hospital. Treatment may include:  Not eating or drinking.  Taking prescription pain medicine.  Receiving antibiotic medicines through an IV tube.  Receiving fluids and nutrition through an IV tube.  Surgery. HOME CARE INSTRUCTIONS   Follow your health care provider's instructions carefully.  Follow a full liquid diet or other diet as directed by your health care provider. After your symptoms improve, your health care provider may tell you to change your diet. He or she may recommend you eat a high-fiber diet. Fruits and vegetables are good sources of fiber. Fiber makes it easier to pass stool.  Take fiber supplements or probiotics as directed by your health care provider.  Only take medicines as directed by your health care provider.  Keep all your follow-up appointments. SEEK MEDICAL CARE IF:   Your pain does not improve.  You have a hard time eating food.  Your bowel movements do not return to normal. SEEK IMMEDIATE MEDICAL CARE IF:   Your pain becomes worse.  Your symptoms do not get better.  Your symptoms suddenly get worse.  You have a fever.  You have repeated vomiting.  You have bloody or black, tarry stools. MAKE SURE YOU:   Understand these instructions.  Will watch your condition.  Will get help right away if you are not doing  well or get worse. Document Released: 03/01/2005 Document Revised: 05/27/2013 Document Reviewed: 04/16/2013 [facility] Patient Information 2015 [place], [place]. This information is not intended to replace advice given to you by your health care  provider. Make sure you discuss any questions you have with your health care provider.  Clear liquid diet: Outpatients should be instructed to consume clear liquids only. Clinical improvement should be evident after two to three days, after which the diet can be advanced slowly. Clear Liquid Diet A clear liquid diet is a short-term diet that is prescribed to provide the necessary fluid and basic energy you need when you can have nothing else. The clear liquid diet consists of liquids or solids that will become liquid at room temperature. You should be able to see through the liquid. There are many reasons that you may be restricted to clear liquids, such as:  When you have a sudden-onset (acute) condition that occurs before or after surgery.  To help your body slowly get adjusted to food again after a long period when you were unable to have food.  Replacement of fluids when you have a diarrheal disease.  When you are going to have certain exams, such as a colonoscopy, in which instruments are inserted inside your body to look at parts of your digestive system. WHAT CAN I HAVE? A clear liquid diet does not provide all the nutrients you need. It is important to choose a variety of the following items to get as many nutrients as possible:  Vegetable juices that do not have pulp.  Fruit juices and fruit drinks that do not have pulp.  Coffee (regular or decaffeinated), tea, or soda at the discretion of your health care provider.  Clear bouillon, broth, or strained broth-based soups.  High-protein and flavored gelatins.  Sugar or honey.  Ices or frozen ice pops that do not contain milk. If you are not sure whether you can have certain items, you should ask your health care provider. You may also ask your health care provider if there are any other clear liquid options. Document Released: 05/22/2005 Document Revised: 05/27/2013 Document Reviewed: 04/18/2013 [facility] Patient Information  2015 [place], [place]. This information is not intended to replace advice given to you by your health care provider. Make sure you discuss any questions you have with your health care provider.

## 2014-03-12 ENCOUNTER — Other Ambulatory Visit: Payer: Self-pay | Admitting: Orthopedic Surgery

## 2014-03-12 ENCOUNTER — Other Ambulatory Visit (HOSPITAL_COMMUNITY): Payer: Self-pay | Admitting: Orthopedic Surgery

## 2014-03-12 DIAGNOSIS — R52 Pain, unspecified: Secondary | ICD-10-CM

## 2015-04-02 DIAGNOSIS — E66813 Obesity, class 3: Secondary | ICD-10-CM | POA: Insufficient documentation

## 2015-04-02 DIAGNOSIS — I2699 Other pulmonary embolism without acute cor pulmonale: Secondary | ICD-10-CM | POA: Insufficient documentation

## 2015-04-02 DIAGNOSIS — L0390 Cellulitis, unspecified: Secondary | ICD-10-CM | POA: Insufficient documentation

## 2015-04-13 ENCOUNTER — Telehealth: Payer: Self-pay | Admitting: Cardiovascular Disease

## 2015-04-13 NOTE — Telephone Encounter (Signed)
Received records from [facility] for appointment on 05/14/15 with Dr Gwenlyn Found.  Records given to [facility] (medical records) for Dr Kennon Holter schedule on 05/14/15. lp

## 2015-05-14 ENCOUNTER — Ambulatory Visit (INDEPENDENT_AMBULATORY_CARE_PROVIDER_SITE_OTHER): Payer: 59 | Admitting: Cardiovascular Disease

## 2015-05-14 ENCOUNTER — Encounter: Payer: Self-pay | Admitting: Cardiovascular Disease

## 2015-05-14 VITALS — BP 130/86 | HR 81 | Ht 72.0 in | Wt >= 6400 oz

## 2015-05-14 DIAGNOSIS — Z86711 Personal history of pulmonary embolism: Secondary | ICD-10-CM | POA: Diagnosis not present

## 2015-05-14 DIAGNOSIS — I10 Essential (primary) hypertension: Secondary | ICD-10-CM

## 2015-05-14 DIAGNOSIS — G4730 Sleep apnea, unspecified: Secondary | ICD-10-CM

## 2015-05-14 DIAGNOSIS — I481 Persistent atrial fibrillation: Secondary | ICD-10-CM

## 2015-05-14 DIAGNOSIS — R600 Localized edema: Secondary | ICD-10-CM

## 2015-05-14 DIAGNOSIS — I4891 Unspecified atrial fibrillation: Secondary | ICD-10-CM | POA: Insufficient documentation

## 2015-05-14 DIAGNOSIS — I82409 Acute embolism and thrombosis of unspecified deep veins of unspecified lower extremity: Secondary | ICD-10-CM | POA: Insufficient documentation

## 2015-05-14 DIAGNOSIS — I4819 Other persistent atrial fibrillation: Secondary | ICD-10-CM

## 2015-05-14 NOTE — Assessment & Plan Note (Signed)
History of hypertension with blood pressure agitated 130/86. He is on losartan and metoprolol. Continue current meds at current dosing

## 2015-05-14 NOTE — Assessment & Plan Note (Signed)
History of bilateral lower extremity edema 3-4+ pitting today on exam on furosemide 80 mg a day which she has not taken in several days. I've urged him to go back on his oral diuretic

## 2015-05-14 NOTE — Assessment & Plan Note (Signed)
History of presumed new onset atrial fibrillation approximately 6 weeks ago in the setting of DVT/PE. He was placed on Coumadin anticoagulation and rate controlled. He is currently asymptomatic. Given his size I think it would be difficult to cardiovert him and since he is otherwise asymptomatic I recommend anti-coagulation and rate control.

## 2015-05-14 NOTE — Assessment & Plan Note (Signed)
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

## 2015-05-14 NOTE — Assessment & Plan Note (Signed)
On C Pap/BiPAP last 20 years which he benefits from

## 2015-05-14 NOTE — Progress Notes (Signed)
05/14/2015 Jon Carson   1965/11/28  [id number]  Primary Physician Cyndi Bender, PA-C Primary Cardiologist: Lorretta Harp MD Renae Gloss   HPI:  Jon Carson is a 49 year old morbidly overweight married Caucasian male father of one child (1 child who was born with truncus arteriosus diet postop). His mother Jon Carson is also a patient of mine. He was referred for evaluation of atrial fibrillation.  He's been worked for 12 months previously employed at the [employer] for 20 years. He has no cardiac risk factors other than mild hypertension on medication. He has never had a heart attack or stroke. He does have obstructive sleep apnea/20 years on Cipro. He went into [facility] on 03/25/15 with shortness of breath. He was found to be in A. Fib with RVR. He was rate controlled and transition from IV diltiazem to by mouth beta blocker. He was also found to have a right lower extremity DVT and a pulmonary embolus as well which was diagnosed when he was transferred to [facility] by CT angiography. He currently denies chest pain or shortness of breath. His major major limitation is arthritic from his excessive weight.   Current Outpatient Prescriptions  Medication Sig Dispense Refill  . docusate sodium (COLACE) 100 MG capsule Take 1 capsule (100 mg total) by mouth 2 (two) times daily as needed for mild constipation or moderate constipation. 30 capsule 0  . furosemide (LASIX) 80 MG tablet Take 80 mg by mouth daily.    Marland Kitchen levothyroxine (SYNTHROID, LEVOTHROID) 25 MCG tablet Take 25 mcg by mouth See admin instructions. Pt takes a total of 325mg  synthroid    . levothyroxine (SYNTHROID, LEVOTHROID) 300 MCG tablet Take 300 mcg by mouth See admin instructions.     Marland Kitchen loratadine (CLARITIN) 10 MG tablet Take 10 mg by mouth daily as needed. Allergies    . losartan (COZAAR) 25 MG tablet Take 25 mg by mouth daily. Take 1/2 tablet (12.5 mg) daily.    . metoprolol  succinate (TOPROL-XL) 100 MG 24 hr tablet Take 100 mg by mouth daily. Take with or immediately following a meal.    . metoprolol succinate (TOPROL-XL) 25 MG 24 hr tablet Take 25 mg by mouth daily.    Marland Kitchen omeprazole (PRILOSEC) 20 MG capsule Take 20 mg by mouth 2 (two) times daily before a meal. Heart burn    . warfarin (COUMADIN) 5 MG tablet Take 5 mg by mouth daily. Take 2.5 mg on Monday and Friday and take 5 mg all other day.     No current facility-administered medications for this visit.    Allergies  Allergen Reactions  . Raspberry Anaphylaxis  . Percocet [Oxycodone-Acetaminophen]     SEVERE CONSTIPATION    Social History   Social History  . Marital Status: Married    Spouse Name: N/A  . Number of Children: 2  . Years of Education: N/A   Occupational History  . [employer]     recruiting   Social History Main Topics  . Smoking status: Never Smoker   . Smokeless tobacco: Never Used  . Alcohol Use: No  . Drug Use: No  . Sexual Activity: Not on file   Other Topics Concern  . Not on file   Social History Narrative     Review of Systems: General: negative for chills, fever, night sweats or weight changes.  Cardiovascular: negative for chest pain, dyspnea on exertion, edema, orthopnea, palpitations, paroxysmal nocturnal dyspnea or shortness of breath  Dermatological: negative for rash Respiratory: negative for cough or wheezing Urologic: negative for hematuria Abdominal: negative for nausea, vomiting, diarrhea, bright red blood per rectum, melena, or hematemesis Neurologic: negative for visual changes, syncope, or dizziness All other systems reviewed and are otherwise negative except as noted above.    Blood pressure 130/86, pulse 81, height 6' (1.829 m), weight 466 lb (211.376 kg).  General appearance: alert and no distress Neck: no adenopathy, no carotid bruit, no JVD, supple, symmetrical, trachea midline and thyroid not enlarged, symmetric, no  tenderness/mass/nodules Lungs: clear to auscultation bilaterally Heart: irregularly irregular rhythm Extremities: 3-4+ pitting edema bilaterally  EKG atrial fibrillation with a ventricular response of 81 and nonspecific ST and T-wave changes. I personally reviewed this EKG  ASSESSMENT AND PLAN:   Sleep apnea On C Pap/BiPAP last 20 years which he benefits from  Essential hypertension History of hypertension with blood pressure agitated 130/86. He is on losartan and metoprolol. Continue current meds at current dosing  Atrial fibrillation [facility]) History of presumed new onset atrial fibrillation approximately 6 weeks ago in the setting of DVT/PE. He was placed on Coumadin anticoagulation and rate controlled. He is currently asymptomatic. Given his size I think it would be difficult to cardiovert him and since he is otherwise asymptomatic I recommend anti-coagulation and rate control.  DVT, lower extremity ([place]) History of right calf DVT with subsequent CT documented primary embolus on oral anticoagulation.  Bilateral lower extremity edema History of bilateral lower extremity edema 3-4+ pitting today on exam on furosemide 80 mg a day which she has not taken in several days. I've urged him to go back on his oral diuretic      Lorretta Harp MD [facility], [facility] 05/14/2015 9:18 AM

## 2015-05-14 NOTE — Patient Instructions (Signed)
Medication Instructions:  Your physician recommends that you continue on your current medications as directed. Please refer to the Current Medication list given to you today.'  Labwork: none  Testing/Procedures: none  Follow-Up: Your physician wants you to follow-up in: 6 month with Dr. Gwenlyn Found. You will receive a reminder letter in the mail two months in advance. If you don't receive a letter, please call our office to schedule the follow-up appointment.   Any Other Special Instructions Will Be Listed Below (If Applicable).     If you need a refill on your cardiac medications before your next appointment, please call your pharmacy.

## 2016-06-26 ENCOUNTER — Encounter: Payer: Self-pay | Admitting: Gastroenterology

## 2017-01-25 ENCOUNTER — Ambulatory Visit: Payer: Medicaid Other | Attending: Neurology

## 2017-01-25 DIAGNOSIS — G4733 Obstructive sleep apnea (adult) (pediatric): Secondary | ICD-10-CM | POA: Diagnosis not present

## 2017-01-25 DIAGNOSIS — R0683 Snoring: Secondary | ICD-10-CM | POA: Insufficient documentation

## 2017-03-02 ENCOUNTER — Ambulatory Visit: Payer: Medicaid Other | Attending: Physician Assistant

## 2017-03-02 DIAGNOSIS — G4733 Obstructive sleep apnea (adult) (pediatric): Secondary | ICD-10-CM | POA: Diagnosis not present

## 2017-03-12 ENCOUNTER — Encounter: Payer: Self-pay | Admitting: Neurology

## 2017-03-12 ENCOUNTER — Ambulatory Visit (INDEPENDENT_AMBULATORY_CARE_PROVIDER_SITE_OTHER): Payer: Medicaid Other | Admitting: Neurology

## 2017-03-12 VITALS — BP 158/84 | HR 58 | Ht 72.0 in | Wt >= 6400 oz

## 2017-03-12 DIAGNOSIS — I82499 Acute embolism and thrombosis of other specified deep vein of unspecified lower extremity: Secondary | ICD-10-CM | POA: Diagnosis not present

## 2017-03-12 DIAGNOSIS — G4730 Sleep apnea, unspecified: Secondary | ICD-10-CM | POA: Diagnosis not present

## 2017-03-12 DIAGNOSIS — R51 Headache: Secondary | ICD-10-CM

## 2017-03-12 DIAGNOSIS — R519 Headache, unspecified: Secondary | ICD-10-CM

## 2017-03-12 MED ORDER — SUMATRIPTAN SUCCINATE 25 MG PO TABS: 25.0000 mg | ORAL_TABLET | ORAL | 11 refills | Status: AC | PRN

## 2017-03-12 MED ORDER — NORTRIPTYLINE HCL 25 MG PO CAPS: 50.0000 mg | ORAL_CAPSULE | Freq: Every day | ORAL | 11 refills | Status: DC

## 2017-03-12 NOTE — Progress Notes (Signed)
PATIENT: Jon Carson DOB: 1965-11-23  Chief Complaint  Patient presents with  . Headache/Trigeminal Neuralgia    Reports headaches that radiate to left eye.  Headaches tend to start as dull, achy pain and progress to stabbing pain behind eye and left cheek tingling.  Episodes tend to last several hours to one week.  They will sometimes cause left-eye watering, visual disturbances and nausea.  His PCP placed him on Tegretol 100mg , BID which has been some helpful.  Marland Kitchen PCP    Cyndi Bender, PA-C     HISTORICAL  Jon Carson is a 51 year old male, seen in refer by his primary care PA Cyndi Bender for evaluation of headache, trigeminal neuralgia, initial evaluation was October eighth 2018,  Have reviewed and summarized referring note, he had past medical history of right renal mass, probable cyst, followed by urologist, obstructive sleep apnea, atrial fibrillation on chronic Coumadin treatment, also had a history of DVT, PE, hypertension, hypothyroidism, anxiety, obesity, using CPAP machine.  He denies a previous history of migraine headache, since 2017, he began to have frequent headaches, starting from left parietal region, then become left retro-orbital area pressure constant sharp pain, sometimes is exacerbated to a more severe stabbing pain, with associated light noise sensitivity, he preferred to lie down in dark are grown resting for a while, used to be intermittent, but over the past few months, it become almost daily bases, he was put on Tegretol 100 mg twice a day, which has helped his headache, but it has caused lower INR, which require frequent monitoring and dose adjustment for his Coumadin, he has been on chronic anticoagulation due to history of DVT, PE, and paroxysmal atrial fibrillation,  During his left side headache spells, he has excessive tearing of his left eye, he has tried gabapentin, cause increased bilateral lower extremity swelling, Lyrica, has difficulty  sleeping,  Laboratory evaluations normal CBCs hemoglobin of 17,003, normal CMP creatinine of 1.15,  REVIEW OF SYSTEMS: Full 14 system review of systems performed and notable only for ringing ears, spinning sensation, blurred vision, easy bruising, joint pain, joint swelling, memory loss, dizziness  ALLERGIES: Allergies  Allergen Reactions  . Raspberry Anaphylaxis  . Percocet [Oxycodone-Acetaminophen]     SEVERE CONSTIPATION    HOME MEDICATIONS: Current Outpatient Prescriptions  Medication Sig Dispense Refill  . amitriptyline (ELAVIL) 10 MG tablet Take 10 mg by mouth at bedtime. Taking for neuropathy.    . CarBAMazepine (TEGRETOL PO) Take 100 mg by mouth 2 (two) times daily.    . citalopram (CELEXA) 20 MG tablet Take 20 mg by mouth daily.    Marland Kitchen docusate sodium (COLACE) 100 MG capsule Take 1 capsule (100 mg total) by mouth 2 (two) times daily as needed for mild constipation or moderate constipation. 30 capsule 0  . flecainide (TAMBOCOR) 50 MG tablet Take 50 mg by mouth 2 (two) times daily.    . furosemide (LASIX) 80 MG tablet Take 80 mg by mouth daily.    Marland Kitchen HYDROcodone-acetaminophen (NORCO) 7.5-325 MG tablet Take 2 tablets by mouth as needed for moderate pain.    Marland Kitchen levothyroxine (SYNTHROID, LEVOTHROID) 300 MCG tablet Take 300 mcg by mouth See admin instructions.     Marland Kitchen loratadine (CLARITIN) 10 MG tablet Take 10 mg by mouth daily as needed. Allergies    . losartan (COZAAR) 25 MG tablet Take 25 mg by mouth daily. Take 1/2 tablet (12.5 mg) daily.    . metoprolol succinate (TOPROL-XL) 50 MG 24 hr tablet Take  50 mg by mouth daily. Take with or immediately following a meal.    . omeprazole (PRILOSEC) 20 MG capsule Take 20 mg by mouth 2 (two) times daily before a meal. Heart burn    . warfarin (COUMADIN) 5 MG tablet Take 5 mg by mouth daily. Taking 7.5 mg on M/W/Fri and 5mg  all other days.     No current facility-administered medications for this visit.     PAST MEDICAL HISTORY: Past Medical  History:  Diagnosis Date  . Arthritis    KNEES " REALLY BAD"  . Atrial fibrillation ([place])   . Diverticulosis   . Edema of both legs   . Esophageal reflux   . GERD (gastroesophageal reflux disease)   . Headache   . 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 Hypertension   . Hypothyroidism   . Jaw pain    LEFT - WENT TO DENTIST--TOLD XRAYS SHOWED NO CARTILAGE--HAS BEEN USING NSAID'S  AND USING OTC BITE GUARD AT NIGHT  . Neuropathy   . Obesities, morbid ([place])   . Potassium serum increased    K+ WAS 5.3 IN ER AT [facility] ON 08/19/11--PT STATES ER PHYSICIAN TOOK HIM OFF LISINOPRIL AND PUT HIM ON HCTZ  AND TOLD PT TO FOLLOW UP WITH HIS MEDICAL DOCTOR  . 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 Self-catheterizes urinary bladder   . Shortness of breath    WITH PAIN AND EXERTION  . Sleep apnea    USES CPAP EVERY NIGHT-WILL BRING MAS  . Urethral stricture     PAST SURGICAL HISTORY: Past Surgical History:  Procedure Laterality Date  . CARDIOVERSION    . CHOLECYSTECTOMY    . CYSTOSCOPY W/ URETERAL STENT PLACEMENT  08/23/2011   Procedure: CYSTOSCOPY WITH RETROGRADE PYELOGRAM/URETERAL STENT PLACEMENT;  Surgeon: Dutch Gray, MD;  Location: [facility];  Service: Urology;  Laterality: Left;  . CYSTOSCOPY W/ URETERAL STENT PLACEMENT  09/22/2011   Procedure: CYSTOSCOPY WITH RETROGRADE PYELOGRAM/URETERAL STENT PLACEMENT;  Surgeon: Dutch Gray, MD;  Location: [facility];  Service: Urology;  Laterality: Left;  CYSTOSCOPY/LEFT RETROGRADE PYLEOGRAM/LEFT DIGITAL URETEROSCOPY/ LASER LITHOTRIPSY, LEFT URETERAL STENT PLACEMENT  . HERNIA REPAIR    . KNEE ARTHROSCOPY     Bilateral  . SHOULDER ARTHROSCOPY     LEFT  . STAPEDES SURGERY     X 2  . TONSILLECTOMY    . URETHROTOMY     x3    FAMILY HISTORY: Family History  Problem Relation Age of Onset  . Diabetes Father   . Stroke Father   . Atrial fibrillation Father   . Emphysema  Maternal Grandfather        smoked  . Colon cancer Maternal Grandfather   . Atrial fibrillation Mother     SOCIAL HISTORY:  Social History   Social History  . Marital status: Married    Spouse name: N/A  . Number of children: 1  . Years of education: Associates   Occupational History  . Sales/bus driver    Social History Main Topics  . Smoking status: Never Smoker  . Smokeless tobacco: Never Used  . Alcohol use No  . Drug use: No  . Sexual activity: Not on file   Other Topics Concern  . Not on file   Social History Narrative   Lives at home with wife and daughter.   Right-handed.   1 cup caffeine daily.  PHYSICAL EXAM   Vitals:   03/12/17 1429  BP: (!) 158/84  Pulse: (!) 58  Weight: (!) 464 lb (210.5 kg)  Height: 6' (1.829 m)    Not recorded      Body mass index is 62.93 kg/m.  PHYSICAL EXAMNIATION:  Gen: NAD, conversant, well nourised, obese, well groomed                     Cardiovascular: Regular rate rhythm, no peripheral edema, warm, nontender. Eyes: Conjunctivae clear without exudates or hemorrhage Neck: Supple, no carotid bruits. Pulmonary: Clear to auscultation bilaterally   NEUROLOGICAL EXAM:  MENTAL STATUS: Speech:    Speech is normal; fluent and spontaneous with normal comprehension.  Cognition:     Orientation to time, place and person     Normal recent and remote memory     Normal Attention span and concentration     Normal Language, naming, repeating,spontaneous speech     Fund of knowledge   CRANIAL NERVES: CN II: Visual fields are full to confrontation. Fundoscopic exam is normal with sharp discs and no vascular changes. Pupils are round equal and briskly reactive to light. CN III, IV, VI: extraocular movement are normal. No ptosis. CN V: Facial sensation is intact to pinprick in all 3 divisions bilaterally. Corneal responses are intact.  CN VII: Face is symmetric with normal eye closure and smile. CN VIII: Hearing is  normal to rubbing fingers CN IX, X: Palate elevates symmetrically. Phonation is normal. CN XI: Head turning and shoulder shrug are intact CN XII: Tongue is midline with normal movements and no atrophy.  MOTOR: There is no pronator drift of out-stretched arms. Muscle bulk and tone are normal. Muscle strength is normal. Bilateral lower extremity pitting edema  REFLEXES: Reflexes are 2+ and symmetric at the biceps, triceps, knees, and ankles. Plantar responses are flexor.  SENSORY: Intact to light touch, pinprick, positional sensation and vibratory sensation are intact in fingers and toes.  COORDINATION: Rapid alternating movements and fine finger movements are intact. There is no dysmetria on finger-to-nose and heel-knee-shin.    GAIT/STANCE: Antalgic to knee pain, the body habitus,   DIAGNOSTIC DATA (LABS, IMAGING, TESTING) - I reviewed patient records, labs, notes, testing and imaging myself where available.   ASSESSMENT AND PLAN  Jon Carson is a 51 y.o. 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 Ph.D.  [facility] [address], [place], [place] [address] Ph: [phone] Fax: [phone]  CC: Cyndi Bender, PA-C

## 2017-03-16 ENCOUNTER — Encounter: Payer: Self-pay | Admitting: Neurology

## 2017-03-19 ENCOUNTER — Telehealth: Payer: Self-pay | Admitting: Neurology

## 2017-03-19 MED ORDER — AMITRIPTYLINE HCL 10 MG PO TABS: 10.0000 mg | ORAL_TABLET | Freq: Every day | ORAL | 11 refills | Status: DC

## 2017-03-19 MED ORDER — TRAMADOL HCL 50 MG PO TABS: 50.0000 mg | ORAL_TABLET | Freq: Three times a day (TID) | ORAL | 5 refills | Status: DC | PRN

## 2017-03-19 NOTE — Telephone Encounter (Signed)
  -----   Message -----  From: Vic Blackbird  Sent: 03/16/2017  2:36 PM  To: [facility] Clinical Pool  Subject: Non-Urgent Medical Question             I am in need of a different medication, the Nortriptine I was recently given is causing   serious insomnia. I am not sleeping at all with taking this at bedtime. I will resume taking   the amitriptyline I was taking before until I hear from your.   If he think amitriptyline works better, it is ok to go back to amitriptyline, find the dosage he is taking now, and is he happy with the dosage, we may call in for Rx for him,

## 2017-03-19 NOTE — Telephone Encounter (Signed)
Spoke to patient's wife on HIPAA - states patient is doing well on amtriptyline 10mg , qhs.  Says he is wondering what he should take for the stabbing pain during the day.  Per vo by Dr. Krista Blue, provide rx for Tramadol 50mg , take one tab q8h prn, #90 x 5.  He will call back if this plan is not helpful for his symptoms.  Otherwise, he will keep his pending follow up appt.

## 2017-03-19 NOTE — Telephone Encounter (Signed)
Juliann Pulse with [facility] is calling regarding Rx that was received for traMADol (ULTRAM) 50 MG tablet. Juliann Pulse wanted to be sure you knew the patient is getting Hydrocodone from another doctor. Please call to discuss.

## 2017-03-19 NOTE — Addendum Note (Signed)
Addended by: Noberto Retort C on: 03/19/2017 03:22 PM   Modules accepted: Orders

## 2017-03-19 NOTE — Addendum Note (Signed)
Addended by: Noberto Retort C on: 03/19/2017 03:10 PM   Modules accepted: Orders

## 2017-03-19 NOTE — Telephone Encounter (Signed)
Dr. Krista Blue is aware of both prescriptions and has authorized refill of Tramadol.  Returned call to [facility] and they will fill the prescription.

## 2017-03-22 ENCOUNTER — Telehealth: Payer: Self-pay | Admitting: Neurology

## 2017-03-22 MED ORDER — OXCARBAZEPINE 150 MG PO TABS: 150.0000 mg | ORAL_TABLET | Freq: Two times a day (BID) | ORAL | 11 refills | Status: DC

## 2017-03-22 NOTE — Telephone Encounter (Signed)
Jon Carson Medicaid will not cover Tramadol because he is also taking hydrocodone prn.  Called the pharmacy and voided the rx.  Per Dr. Krista Blue (see note dated 03/22/17), she has authorized Trileptal 150mg , BID to help his pain.  Pt is aware and agreeable to this change.  He will continue his amitriptyline 10mg  at bedtime.

## 2017-03-22 NOTE — Telephone Encounter (Signed)
Patient complain intermittent facial pain throughout the day, could not tolerate gabapentin, Lyrica in the past, taking Coumadin, Tegretol works for him, but cause variation of INR,  I have looked up the literature, Trileptal should not have significant connection was Coumadin, Trileptal 150 twice a day  Format: AbstractSend to Epilepsia. 1992 Nov-Dec;33(6):1145-8. Oxcarbazepine does not affect the anticoagulant activity of warfarin. Krmer Jerre Simon, Klosterskov Wynelle Fanny, Menge GP, [place] KD. Author information Abstract The possible interaction of the antiepileptic drug oxcarbazepine (OCBZ) on the anticoagulant effect of warfarin was investigated in 57 healthy male volunteers. After reaching steady-state conditions by repeated administration of warfarin, the prothrombin time (Quick value) was assessed before and after single (600 mg) and multiple dosing (450 mg twice daily in 1 week) of OCBZ. In 7 of the 10 volunteers with evaluable data, the prothrombin time was not significantly different (paired t test) from baseline either after single (p = 0.299) or repeated dosing (p = 0.333), indicating that OCBZ does not interact to any relevant extent with the hypothrombinemic effect of warfarin.

## 2017-03-22 NOTE — Telephone Encounter (Signed)
[place] Medicaid will not cover Tramadol because he is also taking hydrocodone prn.  Called the pharmacy and voided the rx.  Per Dr. Krista Blue - she has authorized Trileptal 150mg , BID to help his pain.  Pt is aware and agreeable to this change.  He will continue his amitriptyline 10mg  at bedtime.

## 2017-03-22 NOTE — Addendum Note (Signed)
Addended by: Noberto Retort C on: 03/22/2017 10:03 AM   Modules accepted: Orders

## 2017-03-24 ENCOUNTER — Ambulatory Visit
Admission: RE | Admit: 2017-03-24 | Discharge: 2017-03-24 | Disposition: A | Discharge status: Home / Self Care | Payer: Medicaid Other | Source: Ambulatory Visit | Attending: Neurology | Admitting: Neurology

## 2017-03-24 DIAGNOSIS — R51 Headache: Secondary | ICD-10-CM | POA: Diagnosis not present

## 2017-03-24 DIAGNOSIS — R519 Headache, unspecified: Secondary | ICD-10-CM

## 2017-04-12 ENCOUNTER — Other Ambulatory Visit: Payer: Self-pay | Admitting: General Practice

## 2017-04-12 ENCOUNTER — Ambulatory Visit
Admission: RE | Admit: 2017-04-12 | Discharge: 2017-04-12 | Disposition: A | Discharge status: Home / Self Care | Payer: Disability Insurance | Source: Ambulatory Visit | Attending: General Practice | Admitting: General Practice

## 2017-04-12 DIAGNOSIS — M1711 Unilateral primary osteoarthritis, right knee: Secondary | ICD-10-CM

## 2017-04-12 DIAGNOSIS — M170 Bilateral primary osteoarthritis of knee: Secondary | ICD-10-CM | POA: Diagnosis not present

## 2017-04-12 DIAGNOSIS — M25761 Osteophyte, right knee: Secondary | ICD-10-CM | POA: Diagnosis not present

## 2017-04-12 DIAGNOSIS — M1712 Unilateral primary osteoarthritis, left knee: Secondary | ICD-10-CM

## 2017-06-13 ENCOUNTER — Ambulatory Visit: Payer: Medicaid Other | Admitting: Neurology

## 2017-08-20 ENCOUNTER — Ambulatory Visit: Payer: Medicaid Other | Admitting: Neurology

## 2017-08-27 ENCOUNTER — Ambulatory Visit: Payer: Medicaid Other | Admitting: Neurology

## 2017-08-27 ENCOUNTER — Encounter: Payer: Self-pay | Admitting: Neurology

## 2017-08-27 VITALS — BP 169/79 | HR 52 | Ht 72.0 in | Wt >= 6400 oz

## 2017-08-27 DIAGNOSIS — I4819 Other persistent atrial fibrillation: Secondary | ICD-10-CM

## 2017-08-27 DIAGNOSIS — I481 Persistent atrial fibrillation: Secondary | ICD-10-CM

## 2017-08-27 DIAGNOSIS — IMO0002 Reserved for concepts with insufficient information to code with codable children: Secondary | ICD-10-CM

## 2017-08-27 DIAGNOSIS — G43709 Chronic migraine without aura, not intractable, without status migrainosus: Secondary | ICD-10-CM

## 2017-08-27 NOTE — Addendum Note (Signed)
Addended by: Marcial Pacas on: 08/27/2017 09:43 AM   Modules accepted: Level of Service

## 2017-08-27 NOTE — Progress Notes (Signed)
PATIENT: Jon Carson DOB: Sep 10, 1965  Chief Complaint  Patient presents with  . Headache    Reports headaches/facial pain has improved with amitriptyline and oxcarbazepine.  He would like to review his MRI.     HISTORICAL  Jon Carson is a 52 year old male, seen in refer by his primary care PA Cyndi Bender for evaluation of headache, trigeminal neuralgia, initial evaluation was October 8th 2018,  Have reviewed and summarized referring note, he had past medical history of right renal mass, probable cyst, followed by urologist, obstructive sleep apnea, atrial fibrillation on chronic Coumadin treatment, also had a history of DVT, PE, hypertension, hypothyroidism, anxiety, obesity, using CPAP machine.  He denies a previous history of migraine headache, since 2017, he began to have frequent headaches, starting from left parietal region, then become left retro-orbital area pressure constant sharp pain, sometimes is exacerbated to a more severe stabbing pain, with associated light noise sensitivity, he preferred to lie down in dark are grown resting for a while, used to be intermittent, but over the past few months, it become almost daily bases, he was put on Tegretol 100 mg twice a day, which has helped his headache, but it has caused lower INR, which require frequent monitoring and dose adjustment for his Coumadin, he has been on chronic anticoagulation due to history of DVT, PE, and paroxysmal atrial fibrillation,  During his left side headache spells, he has excessive tearing of his left eye, he has tried gabapentin, cause increased bilateral lower extremity swelling, Lyrica, has difficulty sleeping,  Laboratory evaluations normal CBCs hemoglobin of 17,003, normal CMP creatinine of 1.15,  UPDATE August 27 2017: He is now taking Trileptal 150 mg twice a day, Elavil 10 mg daily, his headache has significantly improvement, 75% improvement, one or twice each week, much milder, did  not have to try Imitrex  His INR has been stable, we have personally reviewed MRI of the brain in October 2018, mild Chiari malformation, mild supratentorium small vessel disease no acute abnormality  REVIEW OF SYSTEMS: Full 14 system review of systems performed and notable only for ringing ears, spinning sensation, blurred vision, easy bruising, joint pain, joint swelling, memory loss, dizziness  ALLERGIES: Allergies  Allergen Reactions  . Raspberry Anaphylaxis  . Percocet [Oxycodone-Acetaminophen]     SEVERE CONSTIPATION    HOME MEDICATIONS: Current Outpatient Medications  Medication Sig Dispense Refill  . amitriptyline (ELAVIL) 10 MG tablet Take 1 tablet (10 mg total) by mouth at bedtime. 30 tablet 11  . citalopram (CELEXA) 20 MG tablet Take 20 mg by mouth daily.    Marland Kitchen docusate sodium (COLACE) 100 MG capsule Take 1 capsule (100 mg total) by mouth 2 (two) times daily as needed for mild constipation or moderate constipation. 30 capsule 0  . flecainide (TAMBOCOR) 50 MG tablet Take 50 mg by mouth 2 (two) times daily.    . furosemide (LASIX) 80 MG tablet Take 80 mg by mouth daily.    Marland Kitchen HYDROcodone-acetaminophen (NORCO) 7.5-325 MG tablet Take 2 tablets by mouth as needed for moderate pain.    Marland Kitchen levothyroxine (SYNTHROID, LEVOTHROID) 300 MCG tablet Take 300 mcg by mouth See admin instructions.     Marland Kitchen loratadine (CLARITIN) 10 MG tablet Take 10 mg by mouth daily as needed. Allergies    . losartan (COZAAR) 25 MG tablet Take 1/2 tablet (12.5 mg) daily.     . metoprolol succinate (TOPROL-XL) 50 MG 24 hr tablet Take 50 mg by mouth daily. Take with or  immediately following a meal.    . omeprazole (PRILOSEC) 20 MG capsule Take 20 mg by mouth 2 (two) times daily before a meal. Heart burn    . OXcarbazepine (TRILEPTAL) 150 MG tablet Take 1 tablet (150 mg total) by mouth 2 (two) times daily. 60 tablet 11  . SUMAtriptan (IMITREX) 25 MG tablet Take 1 tablet (25 mg total) by mouth every 2 (two) hours as  needed for migraine. May repeat in 2 hours if headache persists or recurs. 10 tablet 11  . warfarin (COUMADIN) 5 MG tablet Take 5 mg by mouth daily. Taking 2.5 mg on M/W/Fri/Sat and 5mg  all other days.     No current facility-administered medications for this visit.     PAST MEDICAL HISTORY: Past Medical History:  Diagnosis Date  . Arthritis    KNEES " REALLY BAD"  . Atrial fibrillation ([place])   . Diverticulosis   . Edema of both legs   . Esophageal reflux   . GERD (gastroesophageal reflux disease)   . Headache   . 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 Hypertension   . Hypothyroidism   . Jaw pain    LEFT - WENT TO DENTIST--TOLD XRAYS SHOWED NO CARTILAGE--HAS BEEN USING NSAID'S  AND USING OTC BITE GUARD AT NIGHT  . Neuropathy   . Obesities, morbid ([place])   . Potassium serum increased    K+ WAS 5.3 IN ER AT [facility] ON 08/19/11--PT STATES ER PHYSICIAN TOOK HIM OFF LISINOPRIL AND PUT HIM ON HCTZ  AND TOLD PT TO FOLLOW UP WITH HIS MEDICAL DOCTOR  . 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 Self-catheterizes urinary bladder   . Shortness of breath    WITH PAIN AND EXERTION  . Sleep apnea    USES CPAP EVERY NIGHT-WILL BRING MAS  . Urethral stricture     PAST SURGICAL HISTORY: Past Surgical History:  Procedure Laterality Date  . CARDIOVERSION    . CHOLECYSTECTOMY    . CYSTOSCOPY W/ URETERAL STENT PLACEMENT  08/23/2011   Procedure: CYSTOSCOPY WITH RETROGRADE PYELOGRAM/URETERAL STENT PLACEMENT;  Surgeon: Dutch Gray, MD;  Location: [facility];  Service: Urology;  Laterality: Left;  . CYSTOSCOPY W/ URETERAL STENT PLACEMENT  09/22/2011   Procedure: CYSTOSCOPY WITH RETROGRADE PYELOGRAM/URETERAL STENT PLACEMENT;  Surgeon: Dutch Gray, MD;  Location: [facility];  Service: Urology;  Laterality: Left;  CYSTOSCOPY/LEFT RETROGRADE PYLEOGRAM/LEFT DIGITAL URETEROSCOPY/ LASER LITHOTRIPSY, LEFT URETERAL STENT PLACEMENT  . HERNIA  REPAIR    . KNEE ARTHROSCOPY     Bilateral  . SHOULDER ARTHROSCOPY     LEFT  . STAPEDES SURGERY     X 2  . TONSILLECTOMY    . URETHROTOMY     x3    FAMILY HISTORY: Family History  Problem Relation Age of Onset  . Diabetes Father   . Stroke Father   . Atrial fibrillation Father   . Emphysema Maternal Grandfather        smoked  . Colon cancer Maternal Grandfather   . Atrial fibrillation Mother     SOCIAL HISTORY:  Social History   Socioeconomic History  . Marital status: Married    Spouse name: Not on file  . Number of children: 1  . Years of education: Associates  . Highest education level: Not on file  Occupational History  . Occupation: Ship broker  Social Needs  . Financial resource strain: Not  on file  . Food insecurity:    Worry: Not on file    Inability: Not on file  . Transportation needs:    Medical: Not on file    Non-medical: Not on file  Tobacco Use  . Smoking status: Never Smoker  . Smokeless tobacco: Never Used  Substance and Sexual Activity  . Alcohol use: No  . Drug use: No  . Sexual activity: Not on file  Lifestyle  . Physical activity:    Days per week: Not on file    Minutes per session: Not on file  . Stress: Not on file  Relationships  . Social connections:    Talks on phone: Not on file    Gets together: Not on file    Attends religious service: Not on file    Active member of club or organization: Not on file    Attends meetings of clubs or organizations: Not on file    Relationship status: Not on file  . Intimate partner violence:    Fear of current or ex partner: Not on file    Emotionally abused: Not on file    Physically abused: Not on file    Forced sexual activity: Not on file  Other Topics Concern  . Not on file  Social History Narrative   Lives at home with wife and daughter.   Right-handed.   1 cup caffeine daily.     PHYSICAL EXAM   Vitals:   08/27/17 0916  BP: (!) 169/79  Pulse: (!) 52  Weight:  (!) 459 lb (208.2 kg)  Height: 6' (1.829 m)    Not recorded      Body mass index is 62.25 kg/m.  PHYSICAL EXAMNIATION:  Gen: NAD, conversant, well nourised, obese, well groomed                     Cardiovascular: Regular rate rhythm, no peripheral edema, warm, nontender. Eyes: Conjunctivae clear without exudates or hemorrhage Neck: Supple, no carotid bruits. Pulmonary: Clear to auscultation bilaterally   NEUROLOGICAL EXAM:  MENTAL STATUS: Speech:    Speech is normal; fluent and spontaneous with normal comprehension.  Cognition:     Orientation to time, place and person     Normal recent and remote memory     Normal Attention span and concentration     Normal Language, naming, repeating,spontaneous speech     Fund of knowledge   CRANIAL NERVES: CN II: Visual fields are full to confrontation. Fundoscopic exam is normal with sharp discs and no vascular changes. Pupils are round equal and briskly reactive to light. CN III, IV, VI: extraocular movement are normal. No ptosis. CN V: Facial sensation is intact to pinprick in all 3 divisions bilaterally. Corneal responses are intact.  CN VII: Face is symmetric with normal eye closure and smile. CN VIII: Hearing is normal to rubbing fingers CN IX, X: Palate elevates symmetrically. Phonation is normal. CN XI: Head turning and shoulder shrug are intact CN XII: Tongue is midline with normal movements and no atrophy.  MOTOR: There is no pronator drift of out-stretched arms. Muscle bulk and tone are normal. Muscle strength is normal. Bilateral lower extremity pitting edema  REFLEXES: Reflexes are 2+ and symmetric at the biceps, triceps, knees, and ankles. Plantar responses are flexor.  SENSORY: Intact to light touch, pinprick, positional sensation and vibratory sensation are intact in fingers and toes.  COORDINATION: Rapid alternating movements and fine finger movements are intact. There is no dysmetria  on finger-to-nose and  heel-knee-shin.    GAIT/STANCE: Antalgic to knee pain, the body habitus,   DIAGNOSTIC DATA (LABS, IMAGING, TESTING) - I reviewed patient records, labs, notes, testing and imaging myself where available.   ASSESSMENT AND PLAN  Jon Carson is a 52 y.o. 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  If he continues to do well the next follow-up visit, may consider tapering off Trileptal and higher dose of amitriptyline  Marcial Pacas, M.D. Ph.D.  [facility] [address] [place], [place] [address] Ph: [phone] Fax: [phone]  CC: Cyndi Bender, PA-C

## 2017-12-27 DIAGNOSIS — I10 Essential (primary) hypertension: Secondary | ICD-10-CM | POA: Diagnosis not present

## 2017-12-27 DIAGNOSIS — Z1331 Encounter for screening for depression: Secondary | ICD-10-CM | POA: Diagnosis not present

## 2017-12-27 DIAGNOSIS — Z79899 Other long term (current) drug therapy: Secondary | ICD-10-CM | POA: Diagnosis not present

## 2017-12-27 DIAGNOSIS — M170 Bilateral primary osteoarthritis of knee: Secondary | ICD-10-CM | POA: Diagnosis not present

## 2017-12-27 DIAGNOSIS — E7800 Pure hypercholesterolemia, unspecified: Secondary | ICD-10-CM | POA: Diagnosis not present

## 2017-12-27 DIAGNOSIS — Z1339 Encounter for screening examination for other mental health and behavioral disorders: Secondary | ICD-10-CM | POA: Diagnosis not present

## 2017-12-27 DIAGNOSIS — G629 Polyneuropathy, unspecified: Secondary | ICD-10-CM | POA: Diagnosis not present

## 2017-12-27 DIAGNOSIS — Z7901 Long term (current) use of anticoagulants: Secondary | ICD-10-CM | POA: Diagnosis not present

## 2017-12-27 DIAGNOSIS — I4891 Unspecified atrial fibrillation: Secondary | ICD-10-CM | POA: Diagnosis not present

## 2017-12-27 DIAGNOSIS — I82401 Acute embolism and thrombosis of unspecified deep veins of right lower extremity: Secondary | ICD-10-CM | POA: Diagnosis not present

## 2017-12-27 DIAGNOSIS — F329 Major depressive disorder, single episode, unspecified: Secondary | ICD-10-CM | POA: Diagnosis not present

## 2017-12-27 DIAGNOSIS — E039 Hypothyroidism, unspecified: Secondary | ICD-10-CM | POA: Diagnosis not present

## 2018-01-17 DIAGNOSIS — Z6841 Body Mass Index (BMI) 40.0 and over, adult: Secondary | ICD-10-CM | POA: Diagnosis not present

## 2018-01-17 DIAGNOSIS — M7581 Other shoulder lesions, right shoulder: Secondary | ICD-10-CM | POA: Diagnosis not present

## 2018-01-17 DIAGNOSIS — G629 Polyneuropathy, unspecified: Secondary | ICD-10-CM | POA: Diagnosis not present

## 2018-01-28 DIAGNOSIS — M25572 Pain in left ankle and joints of left foot: Secondary | ICD-10-CM | POA: Diagnosis not present

## 2018-01-31 DIAGNOSIS — Z7901 Long term (current) use of anticoagulants: Secondary | ICD-10-CM | POA: Diagnosis not present

## 2018-02-26 NOTE — Progress Notes (Signed)
[facility]  PATIENT: Jon Carson DOB: 05/12/1966   REASON FOR VISIT: Follow-up for headache HISTORY FROM: Patient     HISTORY OF PRESENT ILLNESS:UPDATE 9/25/2019CM Jon Carson, 52 year old male returns for follow-up with a history of headaches, trigeminal neuralgia.  He is currently on Trileptal 150 mg twice daily.  Most recent CMP done at primary care on 12/27/2017 with sodium level of 142.  He is also on amitriptyline 25 mg at bedtime.  He is on long-term Coumadin treatment for DVT PE and paroxysmal atrial fibrillation.  Most recent INR 2. He has a history of obstructive sleep apnea and uses his CPAP every night.  He is morbidly obese.  He also has venous insufficiency in both lower extremities.  He reports his headaches are much better control and he occasionally will get a headache in the afternoon that is mild will last 1 to 2 hours.  He does not think it severe enough to take Imitrex.  He prefers to lay down in a dark room and rest for a while.  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  He claims he is hopeful by having more activity he will lose some weight.  He has failed Lyrica and gabapentin due to side effects.  He ambulates with a single-point cane denies any falls.  He returns for reevaluation  08/27/17 Jon Carson is a 52 year old male, seen in refer by his primary care PA Cyndi Bender for evaluation of headache, trigeminal neuralgia, initial evaluation was October 8th 2018,  Have reviewed and summarized referring note, he had past medical history of right renal mass, probable cyst, followed by urologist, obstructive sleep apnea, atrial fibrillation on chronic Coumadin treatment, also had a history of DVT, PE, hypertension, hypothyroidism, anxiety, obesity, using CPAP machine.  He denies a previous history of migraine headache, since 2017, he began to have frequent headaches, starting from left parietal region, then become left  retro-orbital area pressure constant sharp pain, sometimes is exacerbated to a more severe stabbing pain, with associated light noise sensitivity, he preferred to lie down in dark are grown resting for a while, used to be intermittent, but over the past few months, it become almost daily bases, he was put on Tegretol 100 mg twice a day, which has helped his headache, but it has caused lower INR, which require frequent monitoring and dose adjustment for his Coumadin, he has been on chronic anticoagulation due to history of DVT, PE, and paroxysmal atrial fibrillation,  During his left side headache spells, he has excessive tearing of his left eye, he has tried gabapentin, cause increased bilateral lower extremity swelling, Lyrica, has difficulty sleeping,  Laboratory evaluations normal CBCs hemoglobin of 17,003, normal CMP creatinine of 1.15,  UPDATE August 27 2017: He is now taking Trileptal 150 mg twice a day, Elavil 10 mg daily, his headache has significantly improvement, 75% improvement, one or twice each week, much milder, did not have to try Imitrex  His INR has been stable, we have personally reviewed MRI of the brain in October 2018, mild Chiari malformation, mild supratentorium small vessel disease no acute abnormality   REVIEW OF SYSTEMS: Full 14 system review of systems performed and notable only for those listed, all others are neg:  Constitutional: neg  Cardiovascular: neg Ear/Nose/Throat: neg  Skin: neg Eyes: neg Respiratory: neg Gastroitestinal: neg  Hematology/Lymphatic: neg  Endocrine: neg Musculoskeletal:neg Allergy/Immunology: neg Neurological: Trigeminal neuralgia headaches Psychiatric: neg Sleep : neg   ALLERGIES:  Allergies  Allergen Reactions  . Raspberry Anaphylaxis  . Percocet [Oxycodone-Acetaminophen]     SEVERE CONSTIPATION    HOME MEDICATIONS: Outpatient Medications Prior to Visit  Medication Sig Dispense Refill  . amitriptyline (ELAVIL) 25 MG  tablet Take 25 mg by mouth at bedtime.    . docusate sodium (COLACE) 100 MG capsule Take 1 capsule (100 mg total) by mouth 2 (two) times daily as needed for mild constipation or moderate constipation. 30 capsule 0  . flecainide (TAMBOCOR) 50 MG tablet Take 50 mg by mouth 2 (two) times daily.    . furosemide (LASIX) 80 MG tablet Take 80 mg by mouth daily.    Marland Kitchen HYDROcodone-acetaminophen (NORCO) 7.5-325 MG tablet Take 2 tablets by mouth as needed for moderate pain.    . IRBESARTAN PO Take 1 tablet by mouth daily.    Marland Kitchen levothyroxine (SYNTHROID, LEVOTHROID) 300 MCG tablet Take 300 mcg by mouth See admin instructions.     Marland Kitchen loratadine (CLARITIN) 10 MG tablet Take 10 mg by mouth daily as needed. Allergies    . metoprolol succinate (TOPROL-XL) 50 MG 24 hr tablet Take 50 mg by mouth daily. Take with or immediately following a meal.    . omeprazole (PRILOSEC) 20 MG capsule Take 20 mg by mouth 2 (two) times daily before a meal. Heart burn    . OXcarbazepine (TRILEPTAL) 150 MG tablet Take 1 tablet (150 mg total) by mouth 2 (two) times daily. 60 tablet 11  . warfarin (COUMADIN) 5 MG tablet Take 5 mg by mouth daily. Taking 2.5 mg on M/W/Fri/Sat and 5mg  all other days.    . SUMAtriptan (IMITREX) 25 MG tablet Take 1 tablet (25 mg total) by mouth every 2 (two) hours as needed for migraine. May repeat in 2 hours if headache persists or recurs. (Patient not taking: Reported on 02/27/2018) 10 tablet 11  . amitriptyline (ELAVIL) 10 MG tablet Take 1 tablet (10 mg total) by mouth at bedtime. 30 tablet 11  . citalopram (CELEXA) 20 MG tablet Take 20 mg by mouth daily.    Marland Kitchen losartan (COZAAR) 25 MG tablet Take 1/2 tablet (12.5 mg) daily.      No facility-administered medications prior to visit.     PAST MEDICAL HISTORY: Past Medical History:  Diagnosis Date  . Arthritis    KNEES " REALLY BAD"  . Atrial fibrillation ([place])   . Diverticulosis   . Edema of both legs   . Esophageal reflux   . GERD (gastroesophageal  reflux disease)   . Headache   . 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 Hypertension   . Hypothyroidism   . Jaw pain    LEFT - WENT TO DENTIST--TOLD XRAYS SHOWED NO CARTILAGE--HAS BEEN USING NSAID'S  AND USING OTC BITE GUARD AT NIGHT  . Neuropathy   . Obesities, morbid ([place])   . Potassium serum increased    K+ WAS 5.3 IN ER AT [facility] ON 08/19/11--PT STATES ER PHYSICIAN TOOK HIM OFF LISINOPRIL AND PUT HIM ON HCTZ  AND TOLD PT TO FOLLOW UP WITH HIS MEDICAL DOCTOR  . 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 Self-catheterizes urinary bladder   . Shortness of breath    WITH PAIN AND EXERTION  . Sleep apnea    USES CPAP EVERY NIGHT-WILL BRING MAS  . Urethral stricture     PAST SURGICAL HISTORY: Past Surgical History:  Procedure Laterality Date  . CARDIOVERSION    . CHOLECYSTECTOMY    . CYSTOSCOPY W/ URETERAL STENT PLACEMENT  08/23/2011   Procedure: CYSTOSCOPY WITH RETROGRADE PYELOGRAM/URETERAL STENT PLACEMENT;  Surgeon: Dutch Gray, MD;  Location: [facility];  Service: Urology;  Laterality: Left;  . CYSTOSCOPY W/ URETERAL STENT PLACEMENT  09/22/2011   Procedure: CYSTOSCOPY WITH RETROGRADE PYELOGRAM/URETERAL STENT PLACEMENT;  Surgeon: Dutch Gray, MD;  Location: [facility];  Service: Urology;  Laterality: Left;  CYSTOSCOPY/LEFT RETROGRADE PYLEOGRAM/LEFT DIGITAL URETEROSCOPY/ LASER LITHOTRIPSY, LEFT URETERAL STENT PLACEMENT  . HERNIA REPAIR    . KNEE ARTHROSCOPY     Bilateral  . SHOULDER ARTHROSCOPY     LEFT  . STAPEDES SURGERY     X 2  . TONSILLECTOMY    . URETHROTOMY     x3    FAMILY HISTORY: Family History  Problem Relation Age of Onset  . Diabetes Father   . Stroke Father   . Atrial fibrillation Father   . Emphysema Maternal Grandfather        smoked  . Colon cancer Maternal Grandfather   . Atrial fibrillation Mother     SOCIAL HISTORY: Social History   Socioeconomic History  . Marital status:  Married    Spouse name: Not on file  . Number of children: 1  . Years of education: Associates  . Highest education level: Not on file  Occupational History  . Occupation: Ship broker  Social Needs  . Financial resource strain: Not on file  . Food insecurity:    Worry: Not on file    Inability: Not on file  . Transportation needs:    Medical: Not on file    Non-medical: Not on file  Tobacco Use  . Smoking status: Never Smoker  . Smokeless tobacco: Never Used  Substance and Sexual Activity  . Alcohol use: No  . Drug use: No  . Sexual activity: Not on file  Lifestyle  . Physical activity:    Days per week: Not on file    Minutes per session: Not on file  . Stress: Not on file  Relationships  . Social connections:    Talks on phone: Not on file    Gets together: Not on file    Attends religious service: Not on file    Active member of club or organization: Not on file    Attends meetings of clubs or organizations: Not on file    Relationship status: Not on file  . Intimate partner violence:    Fear of current or ex partner: Not on file    Emotionally abused: Not on file    Physically abused: Not on file    Forced sexual activity: Not on file  Other Topics Concern  . Not on file  Social History Narrative   Lives at home with wife and daughter.   Right-handed.   1 cup caffeine daily.     PHYSICAL EXAM  Vitals:   02/27/18 0939  BP: (!) 142/75  Pulse: 66  Weight: (!) 482 lb 6.4 oz (218.8 kg)  Height: 6\' 2"  (1.88 m)   Body mass index is 61.94 kg/m.  Generalized: Well developed, morbidly obese male in no acute distress , well-groomed Head: normocephalic and atraumatic,. Oropharynx benign  Neck: Supple,  Musculoskeletal: No deformity  Skin pitting edema both lower extremities along with venous stasis discoloration Neurological examination   Mentation: Alert oriented to time, place, history taking. Attention span and concentration appropriate. Recent and  remote memory intact.  Follows all commands speech and language fluent.   Cranial nerve II-XII: Pupils were equal round reactive to light extraocular movements were full, visual field were full on confrontational test. Facial sensation and strength were normal. hearing was intact to finger rubbing bilaterally. Uvula tongue midline. head turning and shoulder shrug were normal and symmetric.Tongue protrusion into cheek strength was normal. Motor: normal bulk and tone, full strength in the BUE, BLE, Sensory: normal and symmetric to light touch, on the face arms and legs Coordination: finger-nose-finger, heel-to-shin bilaterally, no dysmetria Reflexes: Brachioradialis 2/2, biceps 2/2, triceps 2/2, patellar 2/2, Achilles 2/2, plantar responses were flexor bilaterally. Gait and Station: Rising up from seated position without assistance, wide-based mildly unstable gait due to body habitus.  Ambulates with single-point cane DIAGNOSTIC DATA (LABS, IMAGING, TESTING) - I reviewed patient records, labs, notes, testing and imaging myself where available.  Lab Results  Component Value Date   WBC 17.0 (H) 02/24/2014   HGB 16.1 02/24/2014   HCT 47.4 02/24/2014   MCV 91.9 02/24/2014   PLT 311 02/24/2014      Component Value Date/Time   NA 137 02/24/2014 2044   K 3.4 (L) 02/24/2014 2044   CL 96 02/24/2014 2044   CO2 25 02/24/2014 2044   GLUCOSE 107 (H) 02/24/2014 2044   BUN 21 02/24/2014 2044   CREATININE 1.14 02/24/2014 2044   CALCIUM 9.0 02/24/2014 2044   PROT 8.2 02/24/2014 2044   ALBUMIN 4.0 02/24/2014 2044   AST 16 02/24/2014 2044   ALT 17 02/24/2014 2044   ALKPHOS 92 02/24/2014 2044   BILITOT 0.8 02/24/2014 2044   GFRNONAA 75 (L) 02/24/2014 2044   GFRAA 87 (L) 02/24/2014 2044    Lab Results  Component Value Date   TSH 4.762 Test methodology is 3rd generation TSH 02/05/2007      ASSESSMENT AND PLAN Jon Carson is a 52 y.o. male   with recurrent left occipital pain, chronic  migraine headaches atypical cluster headache .he was switched to Trileptal from Tegretol, INR in better control on long-term Coumadin for atrial fibrillation DVT and PE.   He has failed gabapentin and Lyrica in the past due to side effects of increased swelling on gabapentin and increased difficulty with sleeping  on Lyrica.  Most recent sodium level on 12/27/2017 was 142 at primary care office  PLAN: Continue Trileptal at current dose  Continue Amitriptyline 25 mg at night by PCP Continue CPAP every night Imitrex 25mg  as needed severe headache Follow up 6 months Dennie Bible, [facility], [facility], [facility] [address] [place], [place] [address] [phone]

## 2018-02-27 ENCOUNTER — Encounter: Payer: Self-pay | Admitting: Nurse Practitioner

## 2018-02-27 ENCOUNTER — Ambulatory Visit (INDEPENDENT_AMBULATORY_CARE_PROVIDER_SITE_OTHER): Payer: PPO | Admitting: Nurse Practitioner

## 2018-02-27 VITALS — BP 142/75 | HR 66 | Ht 74.0 in | Wt >= 6400 oz

## 2018-02-27 DIAGNOSIS — G43709 Chronic migraine without aura, not intractable, without status migrainosus: Secondary | ICD-10-CM

## 2018-02-27 DIAGNOSIS — G500 Trigeminal neuralgia: Secondary | ICD-10-CM

## 2018-02-27 DIAGNOSIS — IMO0002 Reserved for concepts with insufficient information to code with codable children: Secondary | ICD-10-CM

## 2018-02-27 MED ORDER — OXCARBAZEPINE 150 MG PO TABS: 150.0000 mg | ORAL_TABLET | Freq: Two times a day (BID) | ORAL | 11 refills | Status: DC

## 2018-02-27 NOTE — Patient Instructions (Signed)
Continue Trileptal at current dose  Continue Amitriptyline 25 mg at night  Continue CPAP every night Imitrex 25mg  as needed severe headache Follow up 6 months

## 2018-03-06 DIAGNOSIS — Z7901 Long term (current) use of anticoagulants: Secondary | ICD-10-CM | POA: Diagnosis not present

## 2018-03-29 DIAGNOSIS — Z6841 Body Mass Index (BMI) 40.0 and over, adult: Secondary | ICD-10-CM | POA: Diagnosis not present

## 2018-03-29 DIAGNOSIS — Z79899 Other long term (current) drug therapy: Secondary | ICD-10-CM | POA: Diagnosis not present

## 2018-03-29 DIAGNOSIS — F329 Major depressive disorder, single episode, unspecified: Secondary | ICD-10-CM | POA: Diagnosis not present

## 2018-03-29 DIAGNOSIS — E7800 Pure hypercholesterolemia, unspecified: Secondary | ICD-10-CM | POA: Diagnosis not present

## 2018-03-29 DIAGNOSIS — E039 Hypothyroidism, unspecified: Secondary | ICD-10-CM | POA: Diagnosis not present

## 2018-03-29 DIAGNOSIS — M170 Bilateral primary osteoarthritis of knee: Secondary | ICD-10-CM | POA: Diagnosis not present

## 2018-03-29 DIAGNOSIS — I10 Essential (primary) hypertension: Secondary | ICD-10-CM | POA: Diagnosis not present

## 2018-03-29 DIAGNOSIS — Z23 Encounter for immunization: Secondary | ICD-10-CM | POA: Diagnosis not present

## 2018-03-29 DIAGNOSIS — G629 Polyneuropathy, unspecified: Secondary | ICD-10-CM | POA: Diagnosis not present

## 2018-03-29 DIAGNOSIS — I4891 Unspecified atrial fibrillation: Secondary | ICD-10-CM | POA: Diagnosis not present

## 2018-03-29 DIAGNOSIS — I82401 Acute embolism and thrombosis of unspecified deep veins of right lower extremity: Secondary | ICD-10-CM | POA: Diagnosis not present

## 2018-04-01 DIAGNOSIS — I4891 Unspecified atrial fibrillation: Secondary | ICD-10-CM | POA: Diagnosis not present

## 2018-04-01 DIAGNOSIS — E669 Obesity, unspecified: Secondary | ICD-10-CM | POA: Diagnosis not present

## 2018-04-10 DIAGNOSIS — Z7901 Long term (current) use of anticoagulants: Secondary | ICD-10-CM | POA: Diagnosis not present

## 2018-05-10 DIAGNOSIS — Z7901 Long term (current) use of anticoagulants: Secondary | ICD-10-CM | POA: Diagnosis not present

## 2018-05-24 DIAGNOSIS — L03115 Cellulitis of right lower limb: Secondary | ICD-10-CM | POA: Diagnosis not present

## 2018-05-30 DIAGNOSIS — Z7901 Long term (current) use of anticoagulants: Secondary | ICD-10-CM | POA: Diagnosis not present

## 2018-06-03 DIAGNOSIS — G4733 Obstructive sleep apnea (adult) (pediatric): Secondary | ICD-10-CM | POA: Diagnosis not present

## 2018-06-17 DIAGNOSIS — R609 Edema, unspecified: Secondary | ICD-10-CM | POA: Diagnosis not present

## 2018-06-17 DIAGNOSIS — Z6841 Body Mass Index (BMI) 40.0 and over, adult: Secondary | ICD-10-CM | POA: Diagnosis not present

## 2018-06-17 DIAGNOSIS — R509 Fever, unspecified: Secondary | ICD-10-CM | POA: Diagnosis not present

## 2018-06-17 DIAGNOSIS — L03115 Cellulitis of right lower limb: Secondary | ICD-10-CM | POA: Diagnosis not present

## 2018-07-03 DIAGNOSIS — Z6841 Body Mass Index (BMI) 40.0 and over, adult: Secondary | ICD-10-CM | POA: Diagnosis not present

## 2018-07-03 DIAGNOSIS — I4891 Unspecified atrial fibrillation: Secondary | ICD-10-CM | POA: Diagnosis not present

## 2018-07-03 DIAGNOSIS — M170 Bilateral primary osteoarthritis of knee: Secondary | ICD-10-CM | POA: Diagnosis not present

## 2018-07-03 DIAGNOSIS — E039 Hypothyroidism, unspecified: Secondary | ICD-10-CM | POA: Diagnosis not present

## 2018-07-03 DIAGNOSIS — R791 Abnormal coagulation profile: Secondary | ICD-10-CM | POA: Diagnosis not present

## 2018-07-03 DIAGNOSIS — I82401 Acute embolism and thrombosis of unspecified deep veins of right lower extremity: Secondary | ICD-10-CM | POA: Diagnosis not present

## 2018-07-03 DIAGNOSIS — I10 Essential (primary) hypertension: Secondary | ICD-10-CM | POA: Diagnosis not present

## 2018-07-03 DIAGNOSIS — G629 Polyneuropathy, unspecified: Secondary | ICD-10-CM | POA: Diagnosis not present

## 2018-07-03 DIAGNOSIS — Z79899 Other long term (current) drug therapy: Secondary | ICD-10-CM | POA: Diagnosis not present

## 2018-07-04 DIAGNOSIS — G4733 Obstructive sleep apnea (adult) (pediatric): Secondary | ICD-10-CM | POA: Diagnosis not present

## 2018-08-03 DIAGNOSIS — G4733 Obstructive sleep apnea (adult) (pediatric): Secondary | ICD-10-CM | POA: Diagnosis not present

## 2018-08-07 DIAGNOSIS — Z7901 Long term (current) use of anticoagulants: Secondary | ICD-10-CM | POA: Diagnosis not present

## 2018-08-23 ENCOUNTER — Encounter: Payer: Self-pay | Admitting: Nurse Practitioner

## 2018-08-28 ENCOUNTER — Ambulatory Visit: Payer: PPO | Admitting: Nurse Practitioner

## 2018-08-28 ENCOUNTER — Ambulatory Visit: Payer: PPO | Admitting: Neurology

## 2018-09-02 DIAGNOSIS — G4733 Obstructive sleep apnea (adult) (pediatric): Secondary | ICD-10-CM | POA: Diagnosis not present

## 2018-09-11 DIAGNOSIS — Z7901 Long term (current) use of anticoagulants: Secondary | ICD-10-CM | POA: Diagnosis not present

## 2018-10-02 DIAGNOSIS — I10 Essential (primary) hypertension: Secondary | ICD-10-CM | POA: Diagnosis not present

## 2018-10-02 DIAGNOSIS — Z6841 Body Mass Index (BMI) 40.0 and over, adult: Secondary | ICD-10-CM | POA: Diagnosis not present

## 2018-10-02 DIAGNOSIS — Z125 Encounter for screening for malignant neoplasm of prostate: Secondary | ICD-10-CM | POA: Diagnosis not present

## 2018-10-02 DIAGNOSIS — E7800 Pure hypercholesterolemia, unspecified: Secondary | ICD-10-CM | POA: Diagnosis not present

## 2018-10-02 DIAGNOSIS — Z79899 Other long term (current) drug therapy: Secondary | ICD-10-CM | POA: Diagnosis not present

## 2018-10-02 DIAGNOSIS — M170 Bilateral primary osteoarthritis of knee: Secondary | ICD-10-CM | POA: Diagnosis not present

## 2018-10-02 DIAGNOSIS — E039 Hypothyroidism, unspecified: Secondary | ICD-10-CM | POA: Diagnosis not present

## 2018-10-02 DIAGNOSIS — I82401 Acute embolism and thrombosis of unspecified deep veins of right lower extremity: Secondary | ICD-10-CM | POA: Diagnosis not present

## 2018-10-02 DIAGNOSIS — I4891 Unspecified atrial fibrillation: Secondary | ICD-10-CM | POA: Diagnosis not present

## 2018-10-02 DIAGNOSIS — G629 Polyneuropathy, unspecified: Secondary | ICD-10-CM | POA: Diagnosis not present

## 2018-10-03 DIAGNOSIS — G4733 Obstructive sleep apnea (adult) (pediatric): Secondary | ICD-10-CM | POA: Diagnosis not present

## 2018-10-09 DIAGNOSIS — D225 Melanocytic nevi of trunk: Secondary | ICD-10-CM | POA: Diagnosis not present

## 2018-10-09 DIAGNOSIS — G4733 Obstructive sleep apnea (adult) (pediatric): Secondary | ICD-10-CM | POA: Diagnosis not present

## 2018-10-09 DIAGNOSIS — L720 Epidermal cyst: Secondary | ICD-10-CM | POA: Diagnosis not present

## 2018-10-09 DIAGNOSIS — L821 Other seborrheic keratosis: Secondary | ICD-10-CM | POA: Diagnosis not present

## 2018-10-16 DIAGNOSIS — Z7901 Long term (current) use of anticoagulants: Secondary | ICD-10-CM | POA: Diagnosis not present

## 2018-10-24 DIAGNOSIS — I4819 Other persistent atrial fibrillation: Secondary | ICD-10-CM | POA: Diagnosis not present

## 2018-10-24 DIAGNOSIS — I10 Essential (primary) hypertension: Secondary | ICD-10-CM | POA: Diagnosis not present

## 2018-10-24 DIAGNOSIS — G4733 Obstructive sleep apnea (adult) (pediatric): Secondary | ICD-10-CM | POA: Diagnosis not present

## 2018-10-24 DIAGNOSIS — Z6841 Body Mass Index (BMI) 40.0 and over, adult: Secondary | ICD-10-CM | POA: Diagnosis not present

## 2018-10-30 ENCOUNTER — Telehealth: Payer: Self-pay | Admitting: Nurse Practitioner

## 2018-10-30 NOTE — Telephone Encounter (Signed)
10-30-18 Pt has called and gave verbal consent to file insurance for [email or web address] E-mail confirmed as:[email or web address]  Pt understands that although there may be some limitations with this type of visit, we will take all precautions to reduce any security or privacy concerns.  Pt understands that this will be treated like an in office visit and we will file with pt's insurance, and there may be a patient responsible charge related to this service. *email sent*

## 2018-11-02 DIAGNOSIS — G4733 Obstructive sleep apnea (adult) (pediatric): Secondary | ICD-10-CM | POA: Diagnosis not present

## 2018-11-03 NOTE — Progress Notes (Signed)
Virtual Visit via Video Note  I connected with Jon Carson on 11/03/18 at  9:45 AM EDT by a video enabled telemedicine application and verified that I am speaking with the correct person using two identifiers.  Location: Patient: At his home Provider: In the office    I discussed the limitations of evaluation and management by telemedicine and the availability of in person appointments. The patient expressed understanding and agreed to proceed.  History of Present Illness: 08/27/17 Jon Pizzo Arringtonis a 53 year old male, seen in refer by his primary care PA Cyndi Bender for evaluation of headache, trigeminal neuralgia, initial evaluation was October 8th 2018,  Have reviewed and summarized referring note, he had past medical history of right renal mass, probable cyst, followed by urologist, obstructive sleep apnea, atrial fibrillation on chronic Coumadin treatment, also had a history of DVT, PE, hypertension, hypothyroidism, anxiety, obesity, using CPAP machine.  He denies a previous history of migraine headache, since 2017, he began to have frequent headaches, starting from left parietal region, then become left retro-orbital area pressure constant sharp pain, sometimes is exacerbated to a more severe stabbing pain, with associated light noise sensitivity, he preferred to lie down in dark are grown resting for a while, used to be intermittent, but over the past few months, it become almost daily bases, he was put on Tegretol 100 mg twice a day, which has helped his headache, but it has caused lower INR, which require frequent monitoring and dose adjustment for his Coumadin, he has been on chronic anticoagulation due to history of DVT, PE, and paroxysmal atrial fibrillation,  During his left side headache spells, he has excessive tearing of his left eye, he has tried gabapentin, cause increased bilateral lower extremity swelling, Lyrica, has difficulty sleeping,  Laboratory  evaluations normal CBCs hemoglobin of 17,003, normal CMP creatinine of 1.15,  UPDATE August 27 2017: He is now taking Trileptal 150 mg twice a day, Elavil 10 mg daily, his headache has significantly improvement, 75% improvement,one ortwice each week, much milder, did not have to try Imitrex  His INR has been stable, we have personally reviewed MRI of the brain in October 2018, mild Chiari malformation, mild supratentorium small vessel disease no acute abnormality  9/25/2019CM Jon Carson, 53 year old male returns for follow-up with a history of headaches, trigeminal neuralgia.  He is currently on Trileptal 150 mg twice daily.  Most recent CMP done at primary care on 12/27/2017 with sodium level of 142.  He is also on amitriptyline 25 mg at bedtime.  He is on long-term Coumadin treatment for DVT PE and paroxysmal atrial fibrillation.  Most recent INR 2. He has a history of obstructive sleep apnea and uses his CPAP every night.  He is morbidly obese.  He also has venous insufficiency in both lower extremities.  He reports his headaches are much better control and he occasionally will get a headache in the afternoon that is mild will last 1 to 2 hours.  He does not think it severe enough to take Imitrex.  He prefers to lay down in a dark room and rest for a while.  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  He claims he is hopeful by having more activity he will lose some weight.  He has failed Lyrica and gabapentin due to side effects.  He ambulates with a single-point cane denies any falls.  He returns for reevaluation  Update November 04 2018 SS: Jon Carson is a  53 year old with history of headaches, trigeminal neuralgia.  He is currently taking amitriptyline 25 mg at bedtime, Trileptal 150 mg twice daily.  His left-sided trigeminal neuralgia pain has been under good control.  He says he may have one episode weekly of breakthrough pain.  His pain occurs to the left top of his head, radiates  under his left eye.  There was one time where he had an episode of pain that felt "numb" above and below his left eye, quickly subsided, no other symptoms.  He complains of concern for peripheral neuropathy in bilateral lower extremities.  His pain has gotten worse, now occurs at night and during the day.  He feels that he is wearing rubber socks, bottoms of his feet are numb.  His primary care doctor increased his amitriptyline to 25 mg at bedtime.  He reports he does not have diabetes.  He had lab work done at his primary care office in April 2020.  He says he walks with a cane, has poor balance, denies any falls.  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   Observations/Objective: Alert, very pleasant, obese, speech is clear and concise, answers questions appropriately, follows commands, facial symmetry noted  Assessment and Plan: 1. 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  Paresthesia -He reports concerns for peripheral neuropathy, is obese, describes sensation of pain/numbness in his feet and legs -We will order nerve conduction studies on both lower extremities, EMG -He reports that the discomfort used to just be in the evening, now is in the morning throughout the day, may consider increasing his amitriptyline or Trileptal. Per Dr. Rhea Belton last note in 2019, may consider tapering off Trileptal and higher dose of amitriptyline.  In the past he has been unable to tolerate gabapentin, Lyrica, nortriptyline, and he is taking coumadin.   Follow Up Instructions: Will have EMG with Dr. Krista Blue, can discuss neuropathy/medication adjustment  at that time. Can follow-up with me in 4-6 months    I discussed the assessment and treatment plan with the patient. The patient was provided an opportunity to ask questions and all were answered. The patient agreed with the plan and demonstrated an understanding of the instructions.   The patient was advised to call back or seek an in-person evaluation if the symptoms worsen or if the condition fails to improve as anticipated.  I provided 20 minutes of non-face-to-face time during this encounter.   Evangeline Dakin, DNP  [facility] [address], [place], [place] [address] [phone]

## 2018-11-04 ENCOUNTER — Other Ambulatory Visit: Payer: Self-pay

## 2018-11-04 ENCOUNTER — Encounter: Payer: Self-pay | Admitting: Neurology

## 2018-11-04 ENCOUNTER — Ambulatory Visit (INDEPENDENT_AMBULATORY_CARE_PROVIDER_SITE_OTHER): Payer: PPO | Admitting: Neurology

## 2018-11-04 DIAGNOSIS — G500 Trigeminal neuralgia: Secondary | ICD-10-CM | POA: Diagnosis not present

## 2018-11-04 DIAGNOSIS — R202 Paresthesia of skin: Secondary | ICD-10-CM | POA: Diagnosis not present

## 2018-11-04 NOTE — Progress Notes (Signed)
I have reviewed and agreed above plan. 

## 2018-11-11 ENCOUNTER — Encounter: Payer: Self-pay | Admitting: Neurology

## 2018-11-19 DIAGNOSIS — H524 Presbyopia: Secondary | ICD-10-CM | POA: Diagnosis not present

## 2018-11-20 DIAGNOSIS — Z7901 Long term (current) use of anticoagulants: Secondary | ICD-10-CM | POA: Diagnosis not present

## 2018-12-03 DIAGNOSIS — G4733 Obstructive sleep apnea (adult) (pediatric): Secondary | ICD-10-CM | POA: Diagnosis not present

## 2018-12-11 ENCOUNTER — Other Ambulatory Visit: Payer: Self-pay

## 2018-12-11 ENCOUNTER — Encounter (INDEPENDENT_AMBULATORY_CARE_PROVIDER_SITE_OTHER): Payer: PPO | Admitting: Neurology

## 2018-12-11 ENCOUNTER — Ambulatory Visit (INDEPENDENT_AMBULATORY_CARE_PROVIDER_SITE_OTHER): Payer: PPO | Admitting: Neurology

## 2018-12-11 DIAGNOSIS — Z0289 Encounter for other administrative examinations: Secondary | ICD-10-CM

## 2018-12-11 DIAGNOSIS — R202 Paresthesia of skin: Secondary | ICD-10-CM | POA: Diagnosis not present

## 2018-12-11 NOTE — Procedures (Signed)
Full Name: Jon Carson Gender: Male MRN #: [id number] Date of Birth: 05-04-1966    Visit Date: 12/11/2018 07:45 Age: 53 Years 52 Months Old Examining Physician: Marcial Pacas, MD  Referring Physician: Butler Denmark, NP History:   53 years old obese male, presented with bilateral feet paresthesia.  Summary of the tests: Nerve conduction study: Left sural, superficial peroneal sensory responses were absent.  Left tibial, peroneal motor responses showed significantly decreased the C map amplitude.  Left median, ulnar sensory and motor responses were normal.  Electromyography:  Selected needle examinations were performed at left lower extremity muscles.  The examination is limited because significant bilateral lower extremity edema, skin discoloration.  There was no significant abnormality noted.   Conclusion:  This is an an abnormal study.  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   ------------------------------- Marcial Pacas, M.D. PhD  [facility] [place], [place] [address] Tel: [phone] Fax: [phone]        [facility]    Nerve / Sites Muscle Latency Ref. Amplitude Ref. Rel Amp Segments Distance Velocity Ref. Area    ms ms mV mV %  cm m/s m/s mVms  L Median - APB     Wrist APB 3.6 ?4.4 7.7 ?4.0 100 Wrist - APB 7   29.2     Upper arm APB 8.6  7.0  90.8 Upper arm - Wrist 24 49 ?49 25.5  L Ulnar - ADM     Wrist ADM 2.8 ?3.3 13.4 ?6.0 100 Wrist - ADM 7   40.2     B.Elbow ADM 7.0  12.5  93.6 B.Elbow - Wrist 23 55 ?49 39.4     A.Elbow ADM 8.8  12.5  99.6 A.Elbow - B.Elbow 10 55 ?49 40.8         A.Elbow - Wrist      L Peroneal - EDB     Ankle EDB 11.6 ?6.5 0.7 ?2.0 100 Ankle - EDB 9   4.8     Fib head EDB 25.1  0.1  11.4 Fib head - Ankle 30 22 ?44 0.4     Pop fossa EDB NR  NR  NR Pop fossa - Fib head 10 NR ?44 NR         Pop fossa - Ankle      L Tibial - AH     Ankle AH 5.7 ?5.8 0.8 ?4.0 100 Ankle -  AH 9   4.5     Pop fossa AH 15.6  0.3  33.5 Pop fossa - Ankle 42 42 ?41 1.2             SNC    Nerve / Sites Rec. Site Peak Lat Ref.  Amp Ref. Segments Distance    ms ms V V  cm  L Sural - Ankle (Calf)     Calf Ankle NR ?4.4 NR ?6 Calf - Ankle 14  L Superficial peroneal - Ankle     Lat leg Ankle NR ?4.4 NR ?6 Lat leg - Ankle 14  L Median - Orthodromic (Dig II, Mid palm)     Dig II Wrist 3.2 ?3.4 10 ?10 Dig II - Wrist 13  L Ulnar - Orthodromic, (Dig V, Mid palm)     Dig V Wrist 2.8 ?3.1 6 ?5 Dig V - Wrist 87              F  Wave    Nerve F  Lat Ref.   ms ms  L Ulnar - ADM 30.7 ?32.0         EMG       EMG Summary Table    Spontaneous MUAP Recruitment  Muscle IA Fib PSW Fasc Other Amp Dur. Poly Pattern  L. Tibialis anterior Normal None None None _______ Normal Normal Normal Normal  L. Gastrocnemius (Medial head) Normal None None None _______ Normal Normal Normal Normal  L. Vastus lateralis Normal None None None _______ Normal Normal Normal Normal

## 2019-01-02 DIAGNOSIS — G4733 Obstructive sleep apnea (adult) (pediatric): Secondary | ICD-10-CM | POA: Diagnosis not present

## 2019-01-06 DIAGNOSIS — Z6841 Body Mass Index (BMI) 40.0 and over, adult: Secondary | ICD-10-CM | POA: Diagnosis not present

## 2019-01-06 DIAGNOSIS — I4891 Unspecified atrial fibrillation: Secondary | ICD-10-CM | POA: Diagnosis not present

## 2019-01-06 DIAGNOSIS — I82401 Acute embolism and thrombosis of unspecified deep veins of right lower extremity: Secondary | ICD-10-CM | POA: Diagnosis not present

## 2019-01-06 DIAGNOSIS — Z7901 Long term (current) use of anticoagulants: Secondary | ICD-10-CM | POA: Diagnosis not present

## 2019-01-06 DIAGNOSIS — E039 Hypothyroidism, unspecified: Secondary | ICD-10-CM | POA: Diagnosis not present

## 2019-01-06 DIAGNOSIS — G629 Polyneuropathy, unspecified: Secondary | ICD-10-CM | POA: Diagnosis not present

## 2019-01-06 DIAGNOSIS — Z79899 Other long term (current) drug therapy: Secondary | ICD-10-CM | POA: Diagnosis not present

## 2019-01-06 DIAGNOSIS — M170 Bilateral primary osteoarthritis of knee: Secondary | ICD-10-CM | POA: Diagnosis not present

## 2019-01-06 DIAGNOSIS — I10 Essential (primary) hypertension: Secondary | ICD-10-CM | POA: Diagnosis not present

## 2019-01-06 DIAGNOSIS — E7800 Pure hypercholesterolemia, unspecified: Secondary | ICD-10-CM | POA: Diagnosis not present

## 2019-01-13 DIAGNOSIS — G4733 Obstructive sleep apnea (adult) (pediatric): Secondary | ICD-10-CM | POA: Diagnosis not present

## 2019-02-02 DIAGNOSIS — G4733 Obstructive sleep apnea (adult) (pediatric): Secondary | ICD-10-CM | POA: Diagnosis not present

## 2019-02-11 DIAGNOSIS — R791 Abnormal coagulation profile: Secondary | ICD-10-CM | POA: Diagnosis not present

## 2019-02-11 DIAGNOSIS — Z6841 Body Mass Index (BMI) 40.0 and over, adult: Secondary | ICD-10-CM | POA: Diagnosis not present

## 2019-02-11 DIAGNOSIS — M170 Bilateral primary osteoarthritis of knee: Secondary | ICD-10-CM | POA: Diagnosis not present

## 2019-02-11 DIAGNOSIS — R42 Dizziness and giddiness: Secondary | ICD-10-CM | POA: Diagnosis not present

## 2019-02-11 DIAGNOSIS — Z0000 Encounter for general adult medical examination without abnormal findings: Secondary | ICD-10-CM | POA: Diagnosis not present

## 2019-02-11 DIAGNOSIS — Z23 Encounter for immunization: Secondary | ICD-10-CM | POA: Diagnosis not present

## 2019-02-11 DIAGNOSIS — E791 Lesch-Nyhan syndrome: Secondary | ICD-10-CM | POA: Diagnosis not present

## 2019-02-11 DIAGNOSIS — I10 Essential (primary) hypertension: Secondary | ICD-10-CM | POA: Diagnosis not present

## 2019-02-11 DIAGNOSIS — I4891 Unspecified atrial fibrillation: Secondary | ICD-10-CM | POA: Diagnosis not present

## 2019-02-14 DIAGNOSIS — R791 Abnormal coagulation profile: Secondary | ICD-10-CM | POA: Diagnosis not present

## 2019-02-24 DIAGNOSIS — Z7901 Long term (current) use of anticoagulants: Secondary | ICD-10-CM | POA: Diagnosis not present

## 2019-03-05 DIAGNOSIS — G4733 Obstructive sleep apnea (adult) (pediatric): Secondary | ICD-10-CM | POA: Diagnosis not present

## 2019-03-06 DIAGNOSIS — Z6841 Body Mass Index (BMI) 40.0 and over, adult: Secondary | ICD-10-CM | POA: Diagnosis not present

## 2019-03-06 DIAGNOSIS — I4891 Unspecified atrial fibrillation: Secondary | ICD-10-CM | POA: Diagnosis not present

## 2019-03-14 DIAGNOSIS — I4891 Unspecified atrial fibrillation: Secondary | ICD-10-CM | POA: Diagnosis not present

## 2019-03-14 DIAGNOSIS — G4733 Obstructive sleep apnea (adult) (pediatric): Secondary | ICD-10-CM | POA: Diagnosis not present

## 2019-03-14 DIAGNOSIS — I10 Essential (primary) hypertension: Secondary | ICD-10-CM | POA: Diagnosis not present

## 2019-03-14 DIAGNOSIS — Z6841 Body Mass Index (BMI) 40.0 and over, adult: Secondary | ICD-10-CM | POA: Diagnosis not present

## 2019-03-14 DIAGNOSIS — R7309 Other abnormal glucose: Secondary | ICD-10-CM | POA: Diagnosis not present

## 2019-03-25 ENCOUNTER — Other Ambulatory Visit: Payer: Self-pay | Admitting: *Deleted

## 2019-03-25 MED ORDER — OXCARBAZEPINE 150 MG PO TABS: 150.0000 mg | ORAL_TABLET | Freq: Two times a day (BID) | ORAL | 11 refills | Status: DC

## 2019-03-27 DIAGNOSIS — I4891 Unspecified atrial fibrillation: Secondary | ICD-10-CM | POA: Diagnosis not present

## 2019-04-02 DIAGNOSIS — Z6841 Body Mass Index (BMI) 40.0 and over, adult: Secondary | ICD-10-CM | POA: Diagnosis not present

## 2019-04-02 DIAGNOSIS — F329 Major depressive disorder, single episode, unspecified: Secondary | ICD-10-CM | POA: Diagnosis not present

## 2019-04-02 DIAGNOSIS — I4891 Unspecified atrial fibrillation: Secondary | ICD-10-CM | POA: Diagnosis not present

## 2019-04-02 DIAGNOSIS — M170 Bilateral primary osteoarthritis of knee: Secondary | ICD-10-CM | POA: Diagnosis not present

## 2019-04-04 DIAGNOSIS — G4733 Obstructive sleep apnea (adult) (pediatric): Secondary | ICD-10-CM | POA: Diagnosis not present

## 2019-04-08 DIAGNOSIS — Z79899 Other long term (current) drug therapy: Secondary | ICD-10-CM | POA: Diagnosis not present

## 2019-04-08 DIAGNOSIS — M170 Bilateral primary osteoarthritis of knee: Secondary | ICD-10-CM | POA: Diagnosis not present

## 2019-04-08 DIAGNOSIS — I4891 Unspecified atrial fibrillation: Secondary | ICD-10-CM | POA: Diagnosis not present

## 2019-04-08 DIAGNOSIS — E7800 Pure hypercholesterolemia, unspecified: Secondary | ICD-10-CM | POA: Diagnosis not present

## 2019-04-08 DIAGNOSIS — G629 Polyneuropathy, unspecified: Secondary | ICD-10-CM | POA: Diagnosis not present

## 2019-04-08 DIAGNOSIS — Z7901 Long term (current) use of anticoagulants: Secondary | ICD-10-CM | POA: Diagnosis not present

## 2019-04-08 DIAGNOSIS — Z6841 Body Mass Index (BMI) 40.0 and over, adult: Secondary | ICD-10-CM | POA: Diagnosis not present

## 2019-04-08 DIAGNOSIS — I10 Essential (primary) hypertension: Secondary | ICD-10-CM | POA: Diagnosis not present

## 2019-04-08 DIAGNOSIS — E039 Hypothyroidism, unspecified: Secondary | ICD-10-CM | POA: Diagnosis not present

## 2019-04-08 DIAGNOSIS — L0211 Cutaneous abscess of neck: Secondary | ICD-10-CM | POA: Diagnosis not present

## 2019-04-08 DIAGNOSIS — I82401 Acute embolism and thrombosis of unspecified deep veins of right lower extremity: Secondary | ICD-10-CM | POA: Diagnosis not present

## 2019-04-22 DIAGNOSIS — Z7901 Long term (current) use of anticoagulants: Secondary | ICD-10-CM | POA: Diagnosis not present

## 2019-04-24 DIAGNOSIS — L97919 Non-pressure chronic ulcer of unspecified part of right lower leg with unspecified severity: Secondary | ICD-10-CM | POA: Diagnosis not present

## 2019-04-24 DIAGNOSIS — I83019 Varicose veins of right lower extremity with ulcer of unspecified site: Secondary | ICD-10-CM | POA: Diagnosis not present

## 2019-04-24 DIAGNOSIS — Z6841 Body Mass Index (BMI) 40.0 and over, adult: Secondary | ICD-10-CM | POA: Diagnosis not present

## 2019-04-24 DIAGNOSIS — R221 Localized swelling, mass and lump, neck: Secondary | ICD-10-CM | POA: Diagnosis not present

## 2019-04-28 DIAGNOSIS — I10 Essential (primary) hypertension: Secondary | ICD-10-CM | POA: Diagnosis not present

## 2019-04-28 DIAGNOSIS — I4819 Other persistent atrial fibrillation: Secondary | ICD-10-CM | POA: Diagnosis not present

## 2019-04-28 DIAGNOSIS — M1710 Unilateral primary osteoarthritis, unspecified knee: Secondary | ICD-10-CM | POA: Diagnosis not present

## 2019-04-28 DIAGNOSIS — Z6841 Body Mass Index (BMI) 40.0 and over, adult: Secondary | ICD-10-CM | POA: Diagnosis not present

## 2019-04-28 DIAGNOSIS — G4733 Obstructive sleep apnea (adult) (pediatric): Secondary | ICD-10-CM | POA: Diagnosis not present

## 2019-04-28 DIAGNOSIS — Z7901 Long term (current) use of anticoagulants: Secondary | ICD-10-CM | POA: Diagnosis not present

## 2019-04-28 DIAGNOSIS — I4891 Unspecified atrial fibrillation: Secondary | ICD-10-CM | POA: Diagnosis not present

## 2019-04-28 DIAGNOSIS — E079 Disorder of thyroid, unspecified: Secondary | ICD-10-CM | POA: Diagnosis not present

## 2019-04-28 DIAGNOSIS — Z7984 Long term (current) use of oral hypoglycemic drugs: Secondary | ICD-10-CM | POA: Diagnosis not present

## 2019-04-28 DIAGNOSIS — Z79899 Other long term (current) drug therapy: Secondary | ICD-10-CM | POA: Diagnosis not present

## 2019-04-28 DIAGNOSIS — I82409 Acute embolism and thrombosis of unspecified deep veins of unspecified lower extremity: Secondary | ICD-10-CM | POA: Diagnosis not present

## 2019-04-29 DIAGNOSIS — L089 Local infection of the skin and subcutaneous tissue, unspecified: Secondary | ICD-10-CM | POA: Diagnosis not present

## 2019-04-29 DIAGNOSIS — L723 Sebaceous cyst: Secondary | ICD-10-CM | POA: Diagnosis not present

## 2019-04-30 DIAGNOSIS — Z7901 Long term (current) use of anticoagulants: Secondary | ICD-10-CM | POA: Diagnosis not present

## 2019-05-05 DIAGNOSIS — G4733 Obstructive sleep apnea (adult) (pediatric): Secondary | ICD-10-CM | POA: Diagnosis not present

## 2019-05-07 DIAGNOSIS — M1710 Unilateral primary osteoarthritis, unspecified knee: Secondary | ICD-10-CM | POA: Diagnosis not present

## 2019-05-07 DIAGNOSIS — Z9989 Dependence on other enabling machines and devices: Secondary | ICD-10-CM | POA: Diagnosis not present

## 2019-05-07 DIAGNOSIS — I4891 Unspecified atrial fibrillation: Secondary | ICD-10-CM | POA: Diagnosis not present

## 2019-05-07 DIAGNOSIS — G4733 Obstructive sleep apnea (adult) (pediatric): Secondary | ICD-10-CM | POA: Diagnosis not present

## 2019-05-07 DIAGNOSIS — Z86711 Personal history of pulmonary embolism: Secondary | ICD-10-CM | POA: Diagnosis not present

## 2019-05-07 DIAGNOSIS — I447 Left bundle-branch block, unspecified: Secondary | ICD-10-CM | POA: Diagnosis not present

## 2019-05-07 DIAGNOSIS — Z823 Family history of stroke: Secondary | ICD-10-CM | POA: Diagnosis not present

## 2019-05-07 DIAGNOSIS — K219 Gastro-esophageal reflux disease without esophagitis: Secondary | ICD-10-CM | POA: Diagnosis not present

## 2019-05-07 DIAGNOSIS — Z8673 Personal history of transient ischemic attack (TIA), and cerebral infarction without residual deficits: Secondary | ICD-10-CM | POA: Diagnosis not present

## 2019-05-07 DIAGNOSIS — Z7984 Long term (current) use of oral hypoglycemic drugs: Secondary | ICD-10-CM | POA: Diagnosis not present

## 2019-05-07 DIAGNOSIS — R0602 Shortness of breath: Secondary | ICD-10-CM | POA: Diagnosis not present

## 2019-05-07 DIAGNOSIS — I10 Essential (primary) hypertension: Secondary | ICD-10-CM | POA: Diagnosis not present

## 2019-05-07 DIAGNOSIS — Z9049 Acquired absence of other specified parts of digestive tract: Secondary | ICD-10-CM | POA: Diagnosis not present

## 2019-05-07 DIAGNOSIS — I4819 Other persistent atrial fibrillation: Secondary | ICD-10-CM | POA: Diagnosis not present

## 2019-05-07 DIAGNOSIS — Z6841 Body Mass Index (BMI) 40.0 and over, adult: Secondary | ICD-10-CM | POA: Diagnosis not present

## 2019-05-07 DIAGNOSIS — E039 Hypothyroidism, unspecified: Secondary | ICD-10-CM | POA: Diagnosis not present

## 2019-05-13 DIAGNOSIS — I4891 Unspecified atrial fibrillation: Secondary | ICD-10-CM | POA: Diagnosis not present

## 2019-05-13 DIAGNOSIS — I10 Essential (primary) hypertension: Secondary | ICD-10-CM | POA: Diagnosis not present

## 2019-05-13 DIAGNOSIS — R609 Edema, unspecified: Secondary | ICD-10-CM | POA: Diagnosis not present

## 2019-05-13 DIAGNOSIS — R0602 Shortness of breath: Secondary | ICD-10-CM | POA: Diagnosis not present

## 2019-05-13 DIAGNOSIS — Z6841 Body Mass Index (BMI) 40.0 and over, adult: Secondary | ICD-10-CM | POA: Diagnosis not present

## 2019-05-19 DIAGNOSIS — I4891 Unspecified atrial fibrillation: Secondary | ICD-10-CM | POA: Diagnosis not present

## 2019-05-23 DIAGNOSIS — R791 Abnormal coagulation profile: Secondary | ICD-10-CM | POA: Diagnosis not present

## 2019-05-26 DIAGNOSIS — L03115 Cellulitis of right lower limb: Secondary | ICD-10-CM | POA: Diagnosis not present

## 2019-05-26 DIAGNOSIS — Z6841 Body Mass Index (BMI) 40.0 and over, adult: Secondary | ICD-10-CM | POA: Diagnosis not present

## 2019-06-02 DIAGNOSIS — Z7901 Long term (current) use of anticoagulants: Secondary | ICD-10-CM | POA: Diagnosis not present

## 2019-06-04 DIAGNOSIS — G4733 Obstructive sleep apnea (adult) (pediatric): Secondary | ICD-10-CM | POA: Diagnosis not present

## 2019-06-12 DIAGNOSIS — R791 Abnormal coagulation profile: Secondary | ICD-10-CM | POA: Diagnosis not present

## 2019-06-16 DIAGNOSIS — I4891 Unspecified atrial fibrillation: Secondary | ICD-10-CM | POA: Diagnosis not present

## 2019-06-19 DIAGNOSIS — Z7901 Long term (current) use of anticoagulants: Secondary | ICD-10-CM | POA: Diagnosis not present

## 2019-06-26 DIAGNOSIS — Z7901 Long term (current) use of anticoagulants: Secondary | ICD-10-CM | POA: Diagnosis not present

## 2019-06-30 DIAGNOSIS — Z20822 Contact with and (suspected) exposure to covid-19: Secondary | ICD-10-CM | POA: Diagnosis not present

## 2019-06-30 DIAGNOSIS — Z01812 Encounter for preprocedural laboratory examination: Secondary | ICD-10-CM | POA: Diagnosis not present

## 2019-07-02 DIAGNOSIS — I4891 Unspecified atrial fibrillation: Secondary | ICD-10-CM | POA: Diagnosis not present

## 2019-07-10 DIAGNOSIS — M170 Bilateral primary osteoarthritis of knee: Secondary | ICD-10-CM | POA: Diagnosis not present

## 2019-07-10 DIAGNOSIS — I10 Essential (primary) hypertension: Secondary | ICD-10-CM | POA: Diagnosis not present

## 2019-07-10 DIAGNOSIS — E039 Hypothyroidism, unspecified: Secondary | ICD-10-CM | POA: Diagnosis not present

## 2019-07-10 DIAGNOSIS — I4891 Unspecified atrial fibrillation: Secondary | ICD-10-CM | POA: Diagnosis not present

## 2019-07-10 DIAGNOSIS — Z6841 Body Mass Index (BMI) 40.0 and over, adult: Secondary | ICD-10-CM | POA: Diagnosis not present

## 2019-07-10 DIAGNOSIS — Z79899 Other long term (current) drug therapy: Secondary | ICD-10-CM | POA: Diagnosis not present

## 2019-07-10 DIAGNOSIS — G629 Polyneuropathy, unspecified: Secondary | ICD-10-CM | POA: Diagnosis not present

## 2019-07-10 DIAGNOSIS — I82401 Acute embolism and thrombosis of unspecified deep veins of right lower extremity: Secondary | ICD-10-CM | POA: Diagnosis not present

## 2019-07-10 DIAGNOSIS — R791 Abnormal coagulation profile: Secondary | ICD-10-CM | POA: Diagnosis not present

## 2019-07-17 DIAGNOSIS — Z7901 Long term (current) use of anticoagulants: Secondary | ICD-10-CM | POA: Diagnosis not present

## 2019-07-31 DIAGNOSIS — Z7901 Long term (current) use of anticoagulants: Secondary | ICD-10-CM | POA: Diagnosis not present

## 2019-09-15 DIAGNOSIS — Z6841 Body Mass Index (BMI) 40.0 and over, adult: Secondary | ICD-10-CM | POA: Diagnosis not present

## 2019-09-15 DIAGNOSIS — L602 Onychogryphosis: Secondary | ICD-10-CM | POA: Diagnosis not present

## 2019-09-15 DIAGNOSIS — R791 Abnormal coagulation profile: Secondary | ICD-10-CM | POA: Diagnosis not present

## 2019-09-18 DIAGNOSIS — Z7901 Long term (current) use of anticoagulants: Secondary | ICD-10-CM | POA: Diagnosis not present

## 2019-09-20 DIAGNOSIS — I440 Atrioventricular block, first degree: Secondary | ICD-10-CM | POA: Diagnosis not present

## 2019-09-20 DIAGNOSIS — Z79899 Other long term (current) drug therapy: Secondary | ICD-10-CM | POA: Diagnosis not present

## 2019-09-20 DIAGNOSIS — I10 Essential (primary) hypertension: Secondary | ICD-10-CM | POA: Diagnosis not present

## 2019-09-20 DIAGNOSIS — Z7984 Long term (current) use of oral hypoglycemic drugs: Secondary | ICD-10-CM | POA: Diagnosis not present

## 2019-09-20 DIAGNOSIS — R0602 Shortness of breath: Secondary | ICD-10-CM | POA: Diagnosis not present

## 2019-09-20 DIAGNOSIS — I4891 Unspecified atrial fibrillation: Secondary | ICD-10-CM | POA: Diagnosis not present

## 2019-09-20 DIAGNOSIS — Z7901 Long term (current) use of anticoagulants: Secondary | ICD-10-CM | POA: Diagnosis not present

## 2019-09-20 DIAGNOSIS — R069 Unspecified abnormalities of breathing: Secondary | ICD-10-CM | POA: Diagnosis not present

## 2019-09-20 DIAGNOSIS — R079 Chest pain, unspecified: Secondary | ICD-10-CM | POA: Diagnosis not present

## 2019-09-22 DIAGNOSIS — Z7901 Long term (current) use of anticoagulants: Secondary | ICD-10-CM | POA: Diagnosis not present

## 2019-09-22 DIAGNOSIS — I4891 Unspecified atrial fibrillation: Secondary | ICD-10-CM | POA: Diagnosis not present

## 2019-09-22 DIAGNOSIS — R0602 Shortness of breath: Secondary | ICD-10-CM | POA: Diagnosis not present

## 2019-09-22 DIAGNOSIS — Z6841 Body Mass Index (BMI) 40.0 and over, adult: Secondary | ICD-10-CM | POA: Diagnosis not present

## 2019-09-22 DIAGNOSIS — I10 Essential (primary) hypertension: Secondary | ICD-10-CM | POA: Diagnosis not present

## 2019-10-08 DIAGNOSIS — I82401 Acute embolism and thrombosis of unspecified deep veins of right lower extremity: Secondary | ICD-10-CM | POA: Diagnosis not present

## 2019-10-08 DIAGNOSIS — Z79899 Other long term (current) drug therapy: Secondary | ICD-10-CM | POA: Diagnosis not present

## 2019-10-08 DIAGNOSIS — I4891 Unspecified atrial fibrillation: Secondary | ICD-10-CM | POA: Diagnosis not present

## 2019-10-08 DIAGNOSIS — M170 Bilateral primary osteoarthritis of knee: Secondary | ICD-10-CM | POA: Diagnosis not present

## 2019-10-08 DIAGNOSIS — R791 Abnormal coagulation profile: Secondary | ICD-10-CM | POA: Diagnosis not present

## 2019-10-08 DIAGNOSIS — E039 Hypothyroidism, unspecified: Secondary | ICD-10-CM | POA: Diagnosis not present

## 2019-10-08 DIAGNOSIS — Z6841 Body Mass Index (BMI) 40.0 and over, adult: Secondary | ICD-10-CM | POA: Diagnosis not present

## 2019-10-08 DIAGNOSIS — K219 Gastro-esophageal reflux disease without esophagitis: Secondary | ICD-10-CM | POA: Diagnosis not present

## 2019-10-08 DIAGNOSIS — G629 Polyneuropathy, unspecified: Secondary | ICD-10-CM | POA: Diagnosis not present

## 2019-10-08 DIAGNOSIS — I10 Essential (primary) hypertension: Secondary | ICD-10-CM | POA: Diagnosis not present

## 2019-11-12 DIAGNOSIS — Z7901 Long term (current) use of anticoagulants: Secondary | ICD-10-CM | POA: Diagnosis not present

## 2019-12-11 DIAGNOSIS — I10 Essential (primary) hypertension: Secondary | ICD-10-CM | POA: Diagnosis not present

## 2019-12-11 DIAGNOSIS — K219 Gastro-esophageal reflux disease without esophagitis: Secondary | ICD-10-CM | POA: Diagnosis not present

## 2019-12-11 DIAGNOSIS — Z6841 Body Mass Index (BMI) 40.0 and over, adult: Secondary | ICD-10-CM | POA: Diagnosis not present

## 2019-12-11 DIAGNOSIS — G4733 Obstructive sleep apnea (adult) (pediatric): Secondary | ICD-10-CM | POA: Diagnosis not present

## 2019-12-17 DIAGNOSIS — Z7901 Long term (current) use of anticoagulants: Secondary | ICD-10-CM | POA: Diagnosis not present

## 2020-01-02 DIAGNOSIS — I10 Essential (primary) hypertension: Secondary | ICD-10-CM | POA: Diagnosis not present

## 2020-01-02 DIAGNOSIS — Z86718 Personal history of other venous thrombosis and embolism: Secondary | ICD-10-CM | POA: Diagnosis not present

## 2020-01-02 DIAGNOSIS — G4733 Obstructive sleep apnea (adult) (pediatric): Secondary | ICD-10-CM | POA: Diagnosis not present

## 2020-01-02 DIAGNOSIS — I4891 Unspecified atrial fibrillation: Secondary | ICD-10-CM | POA: Diagnosis not present

## 2020-01-02 DIAGNOSIS — Z79899 Other long term (current) drug therapy: Secondary | ICD-10-CM | POA: Diagnosis not present

## 2020-01-02 DIAGNOSIS — R9431 Abnormal electrocardiogram [ECG] [EKG]: Secondary | ICD-10-CM | POA: Diagnosis not present

## 2020-01-02 DIAGNOSIS — E079 Disorder of thyroid, unspecified: Secondary | ICD-10-CM | POA: Diagnosis not present

## 2020-01-02 DIAGNOSIS — I4819 Other persistent atrial fibrillation: Secondary | ICD-10-CM | POA: Diagnosis not present

## 2020-01-02 DIAGNOSIS — Z7901 Long term (current) use of anticoagulants: Secondary | ICD-10-CM | POA: Diagnosis not present

## 2020-01-02 DIAGNOSIS — Z86711 Personal history of pulmonary embolism: Secondary | ICD-10-CM | POA: Diagnosis not present

## 2020-01-02 DIAGNOSIS — Z6841 Body Mass Index (BMI) 40.0 and over, adult: Secondary | ICD-10-CM | POA: Diagnosis not present

## 2020-01-02 DIAGNOSIS — Z79890 Hormone replacement therapy (postmenopausal): Secondary | ICD-10-CM | POA: Diagnosis not present

## 2020-01-02 DIAGNOSIS — Z9049 Acquired absence of other specified parts of digestive tract: Secondary | ICD-10-CM | POA: Diagnosis not present

## 2020-01-26 DIAGNOSIS — K219 Gastro-esophageal reflux disease without esophagitis: Secondary | ICD-10-CM | POA: Diagnosis not present

## 2020-01-26 DIAGNOSIS — Z79899 Other long term (current) drug therapy: Secondary | ICD-10-CM | POA: Diagnosis not present

## 2020-01-26 DIAGNOSIS — I82401 Acute embolism and thrombosis of unspecified deep veins of right lower extremity: Secondary | ICD-10-CM | POA: Diagnosis not present

## 2020-01-26 DIAGNOSIS — E039 Hypothyroidism, unspecified: Secondary | ICD-10-CM | POA: Diagnosis not present

## 2020-01-26 DIAGNOSIS — R791 Abnormal coagulation profile: Secondary | ICD-10-CM | POA: Diagnosis not present

## 2020-01-26 DIAGNOSIS — M1712 Unilateral primary osteoarthritis, left knee: Secondary | ICD-10-CM | POA: Diagnosis not present

## 2020-01-26 DIAGNOSIS — G629 Polyneuropathy, unspecified: Secondary | ICD-10-CM | POA: Diagnosis not present

## 2020-01-26 DIAGNOSIS — I4891 Unspecified atrial fibrillation: Secondary | ICD-10-CM | POA: Diagnosis not present

## 2020-01-26 DIAGNOSIS — I10 Essential (primary) hypertension: Secondary | ICD-10-CM | POA: Diagnosis not present

## 2020-01-26 DIAGNOSIS — M1711 Unilateral primary osteoarthritis, right knee: Secondary | ICD-10-CM | POA: Diagnosis not present

## 2020-01-26 DIAGNOSIS — Z6841 Body Mass Index (BMI) 40.0 and over, adult: Secondary | ICD-10-CM | POA: Diagnosis not present

## 2020-02-02 DIAGNOSIS — Z7901 Long term (current) use of anticoagulants: Secondary | ICD-10-CM | POA: Diagnosis not present

## 2020-02-16 DIAGNOSIS — R791 Abnormal coagulation profile: Secondary | ICD-10-CM | POA: Diagnosis not present

## 2020-03-02 DIAGNOSIS — Z7901 Long term (current) use of anticoagulants: Secondary | ICD-10-CM | POA: Diagnosis not present

## 2020-03-09 DIAGNOSIS — G4733 Obstructive sleep apnea (adult) (pediatric): Secondary | ICD-10-CM | POA: Diagnosis not present

## 2020-04-04 ENCOUNTER — Other Ambulatory Visit: Payer: Self-pay | Admitting: Neurology

## 2020-04-09 DIAGNOSIS — R791 Abnormal coagulation profile: Secondary | ICD-10-CM | POA: Diagnosis not present

## 2020-04-27 DIAGNOSIS — Z125 Encounter for screening for malignant neoplasm of prostate: Secondary | ICD-10-CM | POA: Diagnosis not present

## 2020-04-27 DIAGNOSIS — G629 Polyneuropathy, unspecified: Secondary | ICD-10-CM | POA: Diagnosis not present

## 2020-04-27 DIAGNOSIS — Z7901 Long term (current) use of anticoagulants: Secondary | ICD-10-CM | POA: Diagnosis not present

## 2020-04-27 DIAGNOSIS — M1711 Unilateral primary osteoarthritis, right knee: Secondary | ICD-10-CM | POA: Diagnosis not present

## 2020-04-27 DIAGNOSIS — I4891 Unspecified atrial fibrillation: Secondary | ICD-10-CM | POA: Diagnosis not present

## 2020-04-27 DIAGNOSIS — Z23 Encounter for immunization: Secondary | ICD-10-CM | POA: Diagnosis not present

## 2020-04-27 DIAGNOSIS — I10 Essential (primary) hypertension: Secondary | ICD-10-CM | POA: Diagnosis not present

## 2020-04-27 DIAGNOSIS — Z79899 Other long term (current) drug therapy: Secondary | ICD-10-CM | POA: Diagnosis not present

## 2020-04-27 DIAGNOSIS — E039 Hypothyroidism, unspecified: Secondary | ICD-10-CM | POA: Diagnosis not present

## 2020-04-27 DIAGNOSIS — M1712 Unilateral primary osteoarthritis, left knee: Secondary | ICD-10-CM | POA: Diagnosis not present

## 2020-04-27 DIAGNOSIS — K219 Gastro-esophageal reflux disease without esophagitis: Secondary | ICD-10-CM | POA: Diagnosis not present

## 2020-04-27 DIAGNOSIS — I82401 Acute embolism and thrombosis of unspecified deep veins of right lower extremity: Secondary | ICD-10-CM | POA: Diagnosis not present

## 2020-04-30 ENCOUNTER — Other Ambulatory Visit: Payer: Self-pay | Admitting: Neurology

## 2020-06-02 DIAGNOSIS — Z7901 Long term (current) use of anticoagulants: Secondary | ICD-10-CM | POA: Diagnosis not present

## 2020-07-07 DIAGNOSIS — Z7901 Long term (current) use of anticoagulants: Secondary | ICD-10-CM | POA: Diagnosis not present

## 2020-07-28 DIAGNOSIS — M1712 Unilateral primary osteoarthritis, left knee: Secondary | ICD-10-CM | POA: Diagnosis not present

## 2020-07-28 DIAGNOSIS — E039 Hypothyroidism, unspecified: Secondary | ICD-10-CM | POA: Diagnosis not present

## 2020-07-28 DIAGNOSIS — K219 Gastro-esophageal reflux disease without esophagitis: Secondary | ICD-10-CM | POA: Diagnosis not present

## 2020-07-28 DIAGNOSIS — M1711 Unilateral primary osteoarthritis, right knee: Secondary | ICD-10-CM | POA: Diagnosis not present

## 2020-07-28 DIAGNOSIS — G500 Trigeminal neuralgia: Secondary | ICD-10-CM | POA: Diagnosis not present

## 2020-07-28 DIAGNOSIS — Z6841 Body Mass Index (BMI) 40.0 and over, adult: Secondary | ICD-10-CM | POA: Diagnosis not present

## 2020-07-28 DIAGNOSIS — I82401 Acute embolism and thrombosis of unspecified deep veins of right lower extremity: Secondary | ICD-10-CM | POA: Diagnosis not present

## 2020-07-28 DIAGNOSIS — G629 Polyneuropathy, unspecified: Secondary | ICD-10-CM | POA: Diagnosis not present

## 2020-07-28 DIAGNOSIS — I4891 Unspecified atrial fibrillation: Secondary | ICD-10-CM | POA: Diagnosis not present

## 2020-07-28 DIAGNOSIS — I10 Essential (primary) hypertension: Secondary | ICD-10-CM | POA: Diagnosis not present

## 2020-07-28 DIAGNOSIS — Z1211 Encounter for screening for malignant neoplasm of colon: Secondary | ICD-10-CM | POA: Diagnosis not present

## 2020-07-28 DIAGNOSIS — Z79899 Other long term (current) drug therapy: Secondary | ICD-10-CM | POA: Diagnosis not present

## 2020-08-02 ENCOUNTER — Ambulatory Visit: Payer: PPO | Admitting: Podiatry

## 2020-08-03 DIAGNOSIS — Z6841 Body Mass Index (BMI) 40.0 and over, adult: Secondary | ICD-10-CM | POA: Diagnosis not present

## 2020-08-03 DIAGNOSIS — I878 Other specified disorders of veins: Secondary | ICD-10-CM | POA: Diagnosis not present

## 2020-08-03 DIAGNOSIS — R609 Edema, unspecified: Secondary | ICD-10-CM | POA: Diagnosis not present

## 2020-08-09 DIAGNOSIS — Z1211 Encounter for screening for malignant neoplasm of colon: Secondary | ICD-10-CM | POA: Diagnosis not present

## 2020-08-09 DIAGNOSIS — Z1212 Encounter for screening for malignant neoplasm of rectum: Secondary | ICD-10-CM | POA: Diagnosis not present

## 2020-08-09 DIAGNOSIS — I4891 Unspecified atrial fibrillation: Secondary | ICD-10-CM | POA: Diagnosis not present

## 2020-08-09 DIAGNOSIS — Z79899 Other long term (current) drug therapy: Secondary | ICD-10-CM | POA: Diagnosis not present

## 2020-08-11 ENCOUNTER — Ambulatory Visit: Payer: PPO | Admitting: Podiatry

## 2020-08-11 ENCOUNTER — Other Ambulatory Visit: Payer: Self-pay

## 2020-08-11 ENCOUNTER — Encounter: Payer: Self-pay | Admitting: Podiatry

## 2020-08-11 DIAGNOSIS — M79676 Pain in unspecified toe(s): Secondary | ICD-10-CM

## 2020-08-11 DIAGNOSIS — B351 Tinea unguium: Secondary | ICD-10-CM

## 2020-08-18 LAB — COLOGUARD: COLOGUARD: POSITIVE — AB

## 2020-08-23 ENCOUNTER — Telehealth: Payer: Self-pay | Admitting: Nurse Practitioner

## 2020-08-23 NOTE — Telephone Encounter (Signed)
Patients wife called regarding the recall that was changed to an OV per Dr. Silverio Decamp they want to make sure that is accurate being he has family hx. Please advise.

## 2020-08-24 NOTE — Telephone Encounter (Signed)
Left message on machine to call back  

## 2020-08-25 ENCOUNTER — Encounter: Payer: Self-pay | Admitting: Physician Assistant

## 2020-08-25 NOTE — Telephone Encounter (Signed)
Scheduled OV for 09/09/20 with APP to discuss recall.

## 2020-09-09 ENCOUNTER — Ambulatory Visit (INDEPENDENT_AMBULATORY_CARE_PROVIDER_SITE_OTHER): Payer: PPO | Admitting: Physician Assistant

## 2020-09-09 ENCOUNTER — Encounter: Payer: Self-pay | Admitting: Physician Assistant

## 2020-09-09 DIAGNOSIS — R195 Other fecal abnormalities: Secondary | ICD-10-CM | POA: Diagnosis not present

## 2020-09-09 DIAGNOSIS — Z7901 Long term (current) use of anticoagulants: Secondary | ICD-10-CM

## 2020-09-09 DIAGNOSIS — I482 Chronic atrial fibrillation, unspecified: Secondary | ICD-10-CM

## 2020-09-09 NOTE — Patient Instructions (Signed)
If you are age 54 or older, your body mass index should be between 23-30. Your Body mass index is 70.5 kg/m. If this is out of the aforementioned range listed, please consider follow up with your Primary Care Provider.  If you are age 11 or younger, your body mass index should be between 19-25. Your Body mass index is 70.5 kg/m. If this is out of the aformentioned range listed, please consider follow up with your Primary Care Provider.   You have been scheduled for a colonoscopy. Please follow written instructions given to you at your visit today.  Please pick up your prep supplies at the pharmacy within the next 1-3 days. If you use inhalers (even only as needed), please bring them with you on the day of your procedure.  It was a pleasure to see you today!  Ellouise Newer, PA-C

## 2020-09-09 NOTE — Progress Notes (Signed)
Chief Complaint: Positive Cologuard  HPI:    Mr. Waln is a 55 year old morbidly obese male with a past medical history of DVT and A. fib on Xarelto, assigned to Dr. Carlean Purl, who was referred to me by Cyndi Bender, PA-C for a complaint of positive Cologuard.      08/28/2006 colonoscopy Dr. Deatra Ina with diverticulosis and a few colon polyps.  Pathology showed hamartomatous polyp.    08/09/2020 + Cologuard test.    Today, the patient tells me that he is doing fairly well, he has noticed some alteration between diarrhea and constipation since being given a pain medicine.  Denies seeing any blood in his stool and tells me that "they really did not tell me what this positive test means".  Denies abdominal pain or blood in his stool.    Patient does tell me he has been through a lot in the past 10 years, he had twins and one of them passed away a year later, he also lost multiple other family members and tells me he has gained a lot of weight and now that his life is finally somewhat leveled out it is hard for him to lose, but he is working on it.  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    Denies fever, chills or symptoms of heartburn or reflux.  Past Medical History:  Diagnosis Date  . Arthritis    KNEES " REALLY BAD"  . Atrial fibrillation ([place])   . Diverticulosis   . Edema of both legs   . Esophageal reflux   . GERD (gastroesophageal reflux disease)   . Headache   . 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 Hypertension   . Hypothyroidism   . Jaw pain    LEFT - WENT TO DENTIST--TOLD XRAYS SHOWED NO CARTILAGE--HAS BEEN USING NSAID'S  AND USING OTC BITE GUARD AT NIGHT  . Kidney stones   . Neuropathy   . Obesities, morbid ([place])   . Potassium serum increased    K+ WAS 5.3 IN ER AT [facility] ON 08/19/11--PT STATES ER PHYSICIAN TOOK HIM OFF LISINOPRIL AND PUT HIM ON HCTZ  AND TOLD PT TO FOLLOW UP WITH HIS  MEDICAL DOCTOR  . 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 Self-catheterizes urinary bladder   . Shortness of breath    WITH PAIN AND EXERTION  . Sleep apnea    USES CPAP EVERY NIGHT-WILL BRING MAS  . Urethral stricture     Past Surgical History:  Procedure Laterality Date  . CARDIOVERSION    . CHOLECYSTECTOMY    . CYSTOSCOPY W/ URETERAL STENT PLACEMENT  08/23/2011   Procedure: CYSTOSCOPY WITH RETROGRADE PYELOGRAM/URETERAL STENT PLACEMENT;  Surgeon: Dutch Gray, MD;  Location: [facility];  Service: Urology;  Laterality: Left;  . CYSTOSCOPY W/ URETERAL STENT PLACEMENT  09/22/2011   Procedure: CYSTOSCOPY WITH RETROGRADE PYELOGRAM/URETERAL STENT PLACEMENT;  Surgeon: Dutch Gray, MD;  Location: [facility];  Service: Urology;  Laterality: Left;  CYSTOSCOPY/LEFT RETROGRADE PYLEOGRAM/LEFT DIGITAL URETEROSCOPY/ LASER LITHOTRIPSY, LEFT URETERAL STENT PLACEMENT  . HERNIA REPAIR    . KNEE ARTHROSCOPY     Bilateral  . SHOULDER ARTHROSCOPY     LEFT  . STAPEDES SURGERY     X 2  . TONSILLECTOMY    . URETHROTOMY     x3 for urethral stricture  Current Outpatient Medications  Medication Sig Dispense Refill  . amiodarone (PACERONE) 200 MG tablet Take 200 mg by mouth daily.    Marland Kitchen amitriptyline (ELAVIL) 25 MG tablet Take 25 mg by mouth at bedtime.    . docusate sodium (COLACE) 100 MG capsule Take 1 capsule (100 mg total) by mouth 2 (two) times daily as needed for mild constipation or moderate constipation. 30 capsule 0  . furosemide (LASIX) 80 MG tablet Take 80 mg by mouth daily as needed.    Marland Kitchen HYDROcodone-acetaminophen (NORCO) 7.5-325 MG tablet Take 2 tablets by mouth every 6 (six) hours as needed for moderate pain.    Marland Kitchen levothyroxine (SYNTHROID) 50 MCG tablet Take 50 mcg by mouth daily before breakfast.    . levothyroxine (SYNTHROID, LEVOTHROID) 300 MCG tablet Take 300 mcg by mouth See admin instructions.    Marland Kitchen loratadine (CLARITIN) 10 MG tablet Take 10 mg by  mouth daily as needed. Allergies    . losartan (COZAAR) 25 MG tablet Take 25 mg by mouth daily.    . metoprolol succinate (TOPROL-XL) 50 MG 24 hr tablet Take 25 mg by mouth daily. Take with or immediately following a meal.    . omeprazole (PRILOSEC) 40 MG capsule Take 40 mg by mouth 2 (two) times daily.    . OXcarbazepine (TRILEPTAL) 150 MG tablet TAKE 1 TABLET BY MOUTH TWICE A DAY 60 tablet 0  . SUMAtriptan (IMITREX) 25 MG tablet Take 1 tablet (25 mg total) by mouth every 2 (two) hours as needed for migraine. May repeat in 2 hours if headache persists or recurs. 10 tablet 11  . XARELTO 20 MG TABS tablet Take 20 mg by mouth daily.     No current facility-administered medications for this visit.    Allergies as of 09/09/2020 - Review Complete 09/09/2020  Allergen Reaction Noted  . Raspberry Anaphylaxis 08/19/2011  . Cefdinir  04/01/2018  . Percocet [oxycodone-acetaminophen]  08/23/2011    Family History  Problem Relation Age of Onset  . Diabetes Father   . Stroke Father   . Atrial fibrillation Father   . Emphysema Maternal Grandfather        smoked  . Colon cancer Maternal Grandfather   . Atrial fibrillation Mother     Social History   Socioeconomic History  . Marital status: Married    Spouse name: Not on file  . Number of children: 1  . Years of education: Associates  . Highest education level: Not on file  Occupational History  . Occupation: Ship broker  Tobacco Use  . Smoking status: Never Smoker  . Smokeless tobacco: Never Used  Vaping Use  . Vaping Use: Never used  Substance and Sexual Activity  . Alcohol use: No  . Drug use: No  . Sexual activity: Not on file  Other Topics Concern  . Not on file  Social History Narrative   Lives at home with wife and daughter.   Right-handed.   1 cup caffeine daily.   Social Determinants of Health   Financial Resource Strain: Not on file  Food Insecurity: Not on file  Transportation Needs: Not on file  Physical  Activity: Not on file  Stress: Not on file  Social Connections: Not on file  Intimate Partner Violence: Not on file    Review of Systems:    Constitutional: No weight loss, fever or chills Skin: No rash  Cardiovascular: No chest pain   Respiratory: No SOB  Gastrointestinal: See HPI and otherwise negative Genitourinary: No  dysuria  Neurological: No headache, dizziness or syncope Musculoskeletal: No new muscle or joint pain Hematologic: No bleeding Psychiatric: No history of depression or anxiety   Physical Exam:  Vital signs: Pulse 72   Ht 6' (1.829 m)   Wt (!) 519 lb 12.8 oz (235.8 kg)   BMI 70.50 kg/m   (unable to get blood pressure as large cuff did not fit) Constitutional:   Pleasant morbidly obese Caucasian male appears to be in NAD, Well developed, Well nourished, alert and cooperative Head:  Normocephalic and atraumatic. Eyes:   PEERL, EOMI. No icterus. Conjunctiva pink. Ears:  Normal auditory acuity. Neck:  Supple Throat: Oral cavity and pharynx without inflammation, swelling or lesion.  Respiratory: Respirations even and unlabored. Lungs clear to auscultation bilaterally.   No wheezes, crackles, or rhonchi.  Cardiovascular: Normal S1, S2. No MRG. Regular rate and rhythm. No peripheral edema, cyanosis or pallor.  Gastrointestinal:  Soft, nondistended, nontender. No rebound or guarding. Normal bowel sounds. No appreciable masses or hepatomegaly. Rectal:  Not performed.  Msk:  Symmetrical without gross deformities. Without edema, no deformity or joint abnormality.  Neurologic:  Alert and  oriented x4;  grossly normal neurologically.  Skin:   Dry and intact without significant lesions or rashes. Psychiatric:  Demonstrates good judgement and reason without abnormal affect or behaviors.  See HPI for recent labs.  Assessment: 1.  Positive Cologuard testing: Last colonoscopy in 2008 with recommendations to repeat in 10 years, patient tells me he did have a negative  Cologuard test 3 years ago, but now it is positive 2.  A. fib on chronic anticoagulation: With Xarelto  Plan: 1.  Scheduled patient for diagnostic colonoscopy at the hospital given his morbid obesity with Dr. Carlean Purl.  Did provide the patient with a detailed list of risks for procedure and he agrees to proceed. 2.  Patient was advised to hold his Xarelto for 2 days prior to time of procedure.  We will communicate with his prescribing physician to ensure that holding his Xarelto is acceptable for him. 3.  Patient to follow in clinic per recommendations from Dr. Carlean Purl after time of procedure.  Ellouise Newer, PA-C [facility] 09/09/2020, 2:44 PM  Cc: Cyndi Bender, PA-C

## 2020-09-22 ENCOUNTER — Telehealth: Payer: Self-pay

## 2020-09-22 ENCOUNTER — Other Ambulatory Visit: Payer: Self-pay

## 2020-09-22 NOTE — Telephone Encounter (Signed)
Clearance letter was faxed to [place] at [facility] Card[facility] services/ Dr.Saiid responded stating it was ok for patient to hold Xarelto 2 days prior to procedure and to restart post procedure. Fax was sent on 09/20/20. I called patient and informed him to hold Xarelto 2 days prior to his procedure. Letter was stampedand sent down to be scanned.

## 2020-09-22 NOTE — Telephone Encounter (Signed)
Called patient and left VM for him to call me back regarding holding his Xarelto.

## 2020-10-06 ENCOUNTER — Other Ambulatory Visit: Payer: Self-pay

## 2020-10-07 ENCOUNTER — Other Ambulatory Visit (HOSPITAL_COMMUNITY)
Admission: RE | Admit: 2020-10-07 | Discharge: 2020-10-07 | Disposition: A | Discharge status: Home / Self Care | Payer: PPO | Source: Ambulatory Visit | Attending: Internal Medicine | Admitting: Internal Medicine

## 2020-10-07 ENCOUNTER — Other Ambulatory Visit (HOSPITAL_COMMUNITY): Payer: PPO

## 2020-10-07 DIAGNOSIS — Z20822 Contact with and (suspected) exposure to covid-19: Secondary | ICD-10-CM | POA: Diagnosis not present

## 2020-10-07 DIAGNOSIS — Z01812 Encounter for preprocedural laboratory examination: Secondary | ICD-10-CM | POA: Diagnosis not present

## 2020-10-08 LAB — SARS CORONAVIRUS 2 (TAT 6-24 HRS): SARS Coronavirus 2: NEGATIVE

## 2020-10-11 ENCOUNTER — Ambulatory Visit (HOSPITAL_COMMUNITY): Payer: PPO | Admitting: Certified Registered Nurse Anesthetist

## 2020-10-11 ENCOUNTER — Encounter (HOSPITAL_COMMUNITY)
Admission: RE | Disposition: A | Discharge status: Home / Self Care | Payer: Self-pay | Source: Home / Self Care | Attending: Internal Medicine

## 2020-10-11 ENCOUNTER — Other Ambulatory Visit: Payer: Self-pay

## 2020-10-11 ENCOUNTER — Encounter (HOSPITAL_COMMUNITY): Payer: Self-pay | Admitting: Internal Medicine

## 2020-10-11 ENCOUNTER — Ambulatory Visit (HOSPITAL_COMMUNITY)
Admission: RE | Admit: 2020-10-11 | Discharge: 2020-10-11 | Disposition: A | Discharge status: Home / Self Care | Payer: PPO | Attending: Internal Medicine | Admitting: Internal Medicine

## 2020-10-11 DIAGNOSIS — Z8249 Family history of ischemic heart disease and other diseases of the circulatory system: Secondary | ICD-10-CM | POA: Diagnosis not present

## 2020-10-11 DIAGNOSIS — Z9049 Acquired absence of other specified parts of digestive tract: Secondary | ICD-10-CM | POA: Insufficient documentation

## 2020-10-11 DIAGNOSIS — Z86010 Personal history of colonic polyps: Secondary | ICD-10-CM

## 2020-10-11 DIAGNOSIS — G629 Polyneuropathy, unspecified: Secondary | ICD-10-CM | POA: Diagnosis not present

## 2020-10-11 DIAGNOSIS — I4891 Unspecified atrial fibrillation: Secondary | ICD-10-CM | POA: Diagnosis not present

## 2020-10-11 DIAGNOSIS — Z7901 Long term (current) use of anticoagulants: Secondary | ICD-10-CM | POA: Insufficient documentation

## 2020-10-11 DIAGNOSIS — Z86718 Personal history of other venous thrombosis and embolism: Secondary | ICD-10-CM | POA: Insufficient documentation

## 2020-10-11 DIAGNOSIS — Z6841 Body Mass Index (BMI) 40.0 and over, adult: Secondary | ICD-10-CM | POA: Insufficient documentation

## 2020-10-11 DIAGNOSIS — Z800 Family history of malignant neoplasm of digestive organs: Secondary | ICD-10-CM | POA: Insufficient documentation

## 2020-10-11 DIAGNOSIS — Z9989 Dependence on other enabling machines and devices: Secondary | ICD-10-CM | POA: Diagnosis not present

## 2020-10-11 DIAGNOSIS — G4730 Sleep apnea, unspecified: Secondary | ICD-10-CM | POA: Diagnosis not present

## 2020-10-11 DIAGNOSIS — Z885 Allergy status to narcotic agent status: Secondary | ICD-10-CM | POA: Diagnosis not present

## 2020-10-11 DIAGNOSIS — Z87442 Personal history of urinary calculi: Secondary | ICD-10-CM | POA: Diagnosis not present

## 2020-10-11 DIAGNOSIS — K635 Polyp of colon: Secondary | ICD-10-CM | POA: Diagnosis not present

## 2020-10-11 DIAGNOSIS — Z79899 Other long term (current) drug therapy: Secondary | ICD-10-CM | POA: Diagnosis not present

## 2020-10-11 DIAGNOSIS — K5730 Diverticulosis of large intestine without perforation or abscess without bleeding: Secondary | ICD-10-CM | POA: Diagnosis not present

## 2020-10-11 DIAGNOSIS — D123 Benign neoplasm of transverse colon: Secondary | ICD-10-CM | POA: Insufficient documentation

## 2020-10-11 DIAGNOSIS — Z881 Allergy status to other antibiotic agents status: Secondary | ICD-10-CM | POA: Diagnosis not present

## 2020-10-11 DIAGNOSIS — Z79890 Hormone replacement therapy (postmenopausal): Secondary | ICD-10-CM | POA: Insufficient documentation

## 2020-10-11 DIAGNOSIS — R195 Other fecal abnormalities: Secondary | ICD-10-CM | POA: Insufficient documentation

## 2020-10-11 HISTORY — PX: POLYPECTOMY: SHX5525

## 2020-10-11 HISTORY — PX: COLONOSCOPY WITH PROPOFOL: SHX5780

## 2020-10-11 SURGERY — COLONOSCOPY WITH PROPOFOL
Anesthesia: Monitor Anesthesia Care

## 2020-10-11 MED ORDER — SODIUM CHLORIDE 0.9 % IV SOLN: INTRAVENOUS | Status: DC

## 2020-10-11 MED ORDER — PROPOFOL 10 MG/ML IV BOLUS
INTRAVENOUS | Status: DC | PRN
  Administered 2020-10-11 (×3): 20 mg via INTRAVENOUS

## 2020-10-11 MED ORDER — PROPOFOL 500 MG/50ML IV EMUL
INTRAVENOUS | Status: DC | PRN
  Administered 2020-10-11: 100 ug/kg/min via INTRAVENOUS

## 2020-10-11 MED ORDER — LIDOCAINE 2% (20 MG/ML) 5 ML SYRINGE
INTRAMUSCULAR | Status: DC | PRN
  Administered 2020-10-11: 100 mg via INTRAVENOUS

## 2020-10-11 MED ORDER — PHENYLEPHRINE 40 MCG/ML (10ML) SYRINGE FOR IV PUSH (FOR BLOOD PRESSURE SUPPORT)
PREFILLED_SYRINGE | INTRAVENOUS | Status: DC | PRN
  Administered 2020-10-11 (×2): 80 ug via INTRAVENOUS

## 2020-10-11 MED ORDER — LACTATED RINGERS IV SOLN: INTRAVENOUS | Status: DC | PRN

## 2020-10-11 SURGICAL SUPPLY — 21 items

## 2020-10-11 NOTE — Anesthesia Postprocedure Evaluation (Signed)
Anesthesia Post Note  Patient: Jon Carson  Procedure(s) Performed: COLONOSCOPY WITH PROPOFOL (N/A ) POLYPECTOMY     Patient location during evaluation: PACU Anesthesia Type: MAC Level of consciousness: awake and alert Pain management: pain level controlled Vital Signs Assessment: post-procedure vital signs reviewed and stable Respiratory status: spontaneous breathing, nonlabored ventilation, respiratory function stable and patient connected to nasal cannula oxygen Cardiovascular status: stable and blood pressure returned to baseline Postop Assessment: no apparent nausea or vomiting Anesthetic complications: no   No complications documented.  Last Vitals:  Vitals:   10/11/20 1010 10/11/20 1020  BP: (!) 174/98 (!) 186/92  Pulse: 76 73  Resp: 17 19  Temp:    SpO2: 98% 99%    Last Pain:  Vitals:   10/11/20 1020  TempSrc:   PainSc: 0-No pain                 Effie Berkshire

## 2020-10-11 NOTE — Transfer of Care (Signed)
Immediate Anesthesia Transfer of Care Note  Patient: Jon Carson  Procedure(s) Performed: COLONOSCOPY WITH PROPOFOL (N/A ) POLYPECTOMY  Patient Location: Endoscopy Unit  Anesthesia Type:MAC  Level of Consciousness: awake, alert , oriented and patient cooperative  Airway & Oxygen Therapy: Patient Spontanous Breathing and Patient connected to face mask oxygen  Post-op Assessment: Report given to RN, Post -op Vital signs reviewed and stable and Patient moving all extremities X 4  Post vital signs: Reviewed and stable  Last Vitals:  Vitals Value Taken Time  BP    Temp    Pulse 78 10/11/20 1004  Resp 18 10/11/20 1004  SpO2 100 % 10/11/20 1004  Vitals shown include unvalidated device data.  Last Pain:  Vitals:   10/11/20 0847  TempSrc: Temporal  PainSc: 0-No pain         Complications: No complications documented.

## 2020-10-11 NOTE — Discharge Instructions (Signed)
I found one tiny polyp.  It looks benign. Not to worry.  I will let you know pathology results and when to have another routine colonoscopy by mail and/or My Chart.   I know you have been through a lot of stress and have put on weight. Stress can contribute to that for sure.  If you are interested - I recommend you check out Dr. Enrigue Catena of [place]. He specializes in helping people lose weight through keto and low carbohydrate diets.  [email or web address] - please check it out. It can change your life.  He also sees patients in a clinic at [facility].   I appreciate the opportunity to care for you.   Gatha Mayer, MD, FACG   YOU HAD AN ENDOSCOPIC PROCEDURE TODAY: Refer to the procedure report and other information in the discharge instructions given to you for any specific questions about what was found during the examination. If this information does not answer your questions, please call Dr. Celesta Aver office at [phone] to clarify.   YOU SHOULD EXPECT: Some feelings of bloating in the abdomen. Passage of more gas than usual. Walking can help get rid of the air that was put into your GI tract during the procedure and reduce the bloating. If you had a lower endoscopy (such as a colonoscopy or flexible sigmoidoscopy) you may notice spotting of blood in your stool or on the toilet paper. Some abdominal soreness may be present for a day or two, also.  DIET: Your first meal following the procedure should be a light meal and then it is ok to progress to your normal diet. A half-sandwich or bowl of soup is an example of a good first meal. Heavy or fried foods are harder to digest and may make you feel nauseous or bloated. Drink plenty of fluids but you should avoid alcoholic beverages for 24 hours.   ACTIVITY: Your care partner should take you home directly after the procedure. You should plan to take it easy, moving slowly for the rest of the day. You can resume normal activity the day after  the procedure however YOU SHOULD NOT DRIVE, use power tools, machinery or perform tasks that involve climbing or major physical exertion for 24 hours (because of the sedation medicines used during the test).   SYMPTOMS TO REPORT IMMEDIATELY: A gastroenterologist can be reached at any hour. Please call [phone]  for any of the following symptoms:  Following lower endoscopy (colonoscopy, flexible sigmoidoscopy) Excessive amounts of blood in the stool  Significant tenderness, worsening of abdominal pains  Swelling of the abdomen that is new, acute  Fever of 100 or higher

## 2020-10-11 NOTE — Anesthesia Preprocedure Evaluation (Signed)
Anesthesia Evaluation  Patient identified by MRN, date of birth, ID band Patient awake    Reviewed: Allergy & Precautions, Patient's Chart, lab work & pertinent test results  Airway Mallampati: II  TM Distance: >3 FB Neck ROM: Limited    Dental  (+) Teeth Intact, Dental Advisory Given   Pulmonary sleep apnea and Continuous Positive Airway Pressure Ventilation ,    breath sounds clear to auscultation       Cardiovascular hypertension, Pt. on home beta blockers + dysrhythmias Atrial Fibrillation  Rhythm:Regular Rate:Normal     Neuro/Psych  Headaches,  Neuromuscular disease negative psych ROS   GI/Hepatic Neg liver ROS, GERD  Medicated,  Endo/Other  Hypothyroidism   Renal/GU Renal disease     Musculoskeletal  (+) Arthritis ,   Abdominal (+) + obese,   Peds  Hematology negative hematology ROS (+)   Anesthesia Other Findings   Reproductive/Obstetrics                             Anesthesia Physical Anesthesia Plan  ASA: IV  Anesthesia Plan: MAC   Post-op Pain Management:    Induction: Intravenous  PONV Risk Score and Plan: 0 and Propofol infusion  Airway Management Planned: Natural Airway and Simple Face Mask  Additional Equipment: None  Intra-op Plan:   Post-operative Plan:   Informed Consent: I have reviewed the patients History and Physical, chart, labs and discussed the procedure including the risks, benefits and alternatives for the proposed anesthesia with the patient or authorized representative who has indicated his/her understanding and acceptance.       Plan Discussed with: CRNA  Anesthesia Plan Comments:         Anesthesia Quick Evaluation

## 2020-10-11 NOTE — Op Note (Signed)
[facility] Patient Name: Jon Carson Procedure Date: 10/11/2020 MRN: [id number] Attending MD: Gatha Mayer , MD Date of Birth: 20-Mar-1966 CSN: [id number] Age: 55 Admit Type: Outpatient Procedure:                Colonoscopy Indications:              Positive Cologuard test Providers:                Gatha Mayer, MD, Ladona Ridgel, Technician,                            Caryl Pina CRNA, Dulcy Fanny RN Referring MD:             Cyndi Bender, Levin Erp Medicines:                Propofol per Anesthesia, Monitored Anesthesia Care Complications:            No immediate complications. Estimated Blood Loss:     Estimated blood loss was minimal. Procedure:                Pre-Anesthesia Assessment:                           - Prior to the procedure, a History and Physical                            was performed, and patient medications and                            allergies were reviewed. The patient's tolerance of                            previous anesthesia was also reviewed. The risks                            and benefits of the procedure and the sedation                            options and risks were discussed with the patient.                            All questions were answered, and informed consent                            was obtained. Prior Anticoagulants: The patient                            last took Xarelto (rivaroxaban) 2 days prior to the                            procedure. ASA Grade Assessment: IV - A patient                            with severe systemic disease that is a constant  threat to life. After reviewing the risks and                            benefits, the patient was deemed in satisfactory                            condition to undergo the procedure.                           After obtaining informed consent, the colonoscope                            was passed under direct  vision. Throughout the                            procedure, the patient's blood pressure, pulse, and                            oxygen saturations were monitored continuously. The                            CF-HQ190L ([id number]) Olympus colonoscope was                            introduced through the anus and advanced to the the                            cecum, identified by appendiceal orifice and                            ileocecal valve. The colonoscopy was performed                            without difficulty. The patient tolerated the                            procedure well. The quality of the bowel                            preparation was good. The ileocecal valve,                            appendiceal orifice, and rectum were photographed.                            The bowel preparation used was Miralax via split                            dose instruction. Scope In: 9:44:27 AM Scope Out: 9:57:50 AM Scope Withdrawal Time: 0 hours 10 minutes 58 seconds  Total Procedure Duration: 0 hours 13 minutes 23 seconds  Findings:      The perianal and digital rectal examinations were normal. Pertinent       negatives include normal prostate (size, shape, and consistency).  A diminutive polyp was found in the transverse colon. The polyp was       sessile. The polyp was removed with a cold snare. Resection and       retrieval were complete. Verification of patient identification for the       specimen was done. Estimated blood loss was minimal.      Multiple diverticula were found in the sigmoid colon.      The exam was otherwise without abnormality on direct and retroflexion       views. Impression:               - One diminutive polyp in the transverse colon,                            removed with a cold snare. Resected and retrieved.                           - Diverticulosis in the sigmoid colon.                           - The examination was otherwise normal on direct                             and retroflexion views.                           - Personal history of colonic polyp hamartoma 2008                            (NOT pre-cancerous). Moderate Sedation:      Not Applicable - Patient had care per Anesthesia. Recommendation:           - Patient has a contact number available for                            emergencies. The signs and symptoms of potential                            delayed complications were discussed with the                            patient. Return to normal activities tomorrow.                            Written discharge instructions were provided to the                            patient.                           - Resume Xarelto (rivaroxaban) at prior dose                            tomorrow.                           - Repeat colonoscopy is recommended. The  colonoscopy date will be determined after pathology                            results from today's exam become available for                            review.                           - I have recommended he look into keto weight loss                            with Dr. Enrigue Catena Procedure Code(s):        --- Professional ---                           [phone], Colonoscopy, flexible; with removal of                            tumor(s), polyp(s), or other lesion(s) by snare                            technique Diagnosis Code(s):        --- Professional ---                           K63.5, Polyp of colon                           Z86.010, Personal history of colonic polyps                           R19.5, Other fecal abnormalities                           K57.30, Diverticulosis of large intestine without                            perforation or abscess without bleeding CPT copyright 2019 American Medical Association. All rights reserved. The codes documented in this report are preliminary and upon coder review may  be revised to meet current  compliance requirements. Gatha Mayer, MD 10/11/2020 10:10:38 AM This report has been signed electronically. Number of Addenda: 0

## 2020-10-11 NOTE — H&P (Signed)
[facility] History and Physical   Primary Care Physician:  Cyndi Bender, PA-C   Reason for Procedure:   + cologuard  Plan:    colonoscopy     HPI: Jon Carson is a 55 y.o. male here for colonoscopy to evaluate + Cologuard. Xarelto was held.   Past Medical History:  Diagnosis Date  . Arthritis    KNEES " REALLY BAD"  . Atrial fibrillation ([place])   . Diverticulosis   . Edema of both legs   . Esophageal reflux   . GERD (gastroesophageal reflux disease)   . Headache   . 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 Hypertension   . Hypothyroidism   . Jaw pain    LEFT - WENT TO DENTIST--TOLD XRAYS SHOWED NO CARTILAGE--HAS BEEN USING NSAID'S  AND USING OTC BITE GUARD AT NIGHT  . Kidney stones   . Neuropathy   . Obesities, morbid ([place])   . Potassium serum increased    K+ WAS 5.3 IN ER AT [facility] ON 08/19/11--PT STATES ER PHYSICIAN TOOK HIM OFF LISINOPRIL AND PUT HIM ON HCTZ  AND TOLD PT TO FOLLOW UP WITH HIS MEDICAL DOCTOR  . 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 Self-catheterizes urinary bladder   . Shortness of breath    WITH PAIN AND EXERTION  . Sleep apnea    USES CPAP EVERY NIGHT-WILL BRING MAS  . Urethral stricture     Past Surgical History:  Procedure Laterality Date  . CARDIOVERSION    . CHOLECYSTECTOMY    . CYSTOSCOPY W/ URETERAL STENT PLACEMENT  08/23/2011   Procedure: CYSTOSCOPY WITH RETROGRADE PYELOGRAM/URETERAL STENT PLACEMENT;  Surgeon: Dutch Gray, MD;  Location: [facility];  Service: Urology;  Laterality: Left;  . CYSTOSCOPY W/ URETERAL STENT PLACEMENT  09/22/2011   Procedure: CYSTOSCOPY WITH RETROGRADE PYELOGRAM/URETERAL STENT PLACEMENT;  Surgeon: Dutch Gray, MD;  Location: [facility];  Service: Urology;  Laterality: Left;  CYSTOSCOPY/LEFT RETROGRADE PYLEOGRAM/LEFT DIGITAL URETEROSCOPY/ LASER LITHOTRIPSY, LEFT URETERAL STENT PLACEMENT  . HERNIA REPAIR    . KNEE  ARTHROSCOPY     Bilateral  . SHOULDER ARTHROSCOPY     LEFT  . STAPEDES SURGERY     X 2  . TONSILLECTOMY    . URETHROTOMY     x3 for urethral stricture    Prior to Admission medications   Medication Sig Start Date End Date Taking? Authorizing Provider  amiodarone (PACERONE) 200 MG tablet Take 200 mg by mouth in the morning. 06/01/20  Yes [provider]  amitriptyline (ELAVIL) 25 MG tablet Take 25 mg by mouth at bedtime.   Yes [provider]  docusate sodium (COLACE) 100 MG capsule Take 100 mg by mouth 2 (two) times daily as needed (prevent constipation with opoid use).   Yes [provider]  furosemide (LASIX) 80 MG tablet Take 80 mg by mouth daily as needed (leg swelling).   Yes [provider]  HYDROcodone-acetaminophen (NORCO) 7.5-325 MG tablet Take 2 tablets by mouth every 6 (six) hours as needed (knee pain).   Yes [provider]  levothyroxine (SYNTHROID) 75 MCG tablet Take 75 mcg by mouth daily before breakfast. 75 mcg + 300 mcg=375 mcg in the morning daily   Yes [provider]  levothyroxine (SYNTHROID, LEVOTHROID) 300 MCG tablet Take 300 mcg by mouth daily before breakfast. 300 mcg + 75 mcg=375 mcg in the morning  daily   Yes [provider]  loratadine (CLARITIN) 10 MG tablet Take 10 mg by mouth daily as needed for allergies.   Yes [provider]  losartan (COZAAR) 25 MG tablet Take 25 mg by mouth in the morning.   Yes [provider]  metoprolol succinate (TOPROL-XL) 50 MG 24 hr tablet Take 25 mg by mouth every evening. Take with or immediately following a meal.   Yes [provider]  naproxen sodium (ALEVE) 220 MG tablet Take 220 mg by mouth at bedtime as needed (joint pain).   Yes [provider]  omeprazole (PRILOSEC) 40 MG capsule Take 40 mg by mouth 2 (two) times daily. 06/06/20  Yes [provider]  OXcarbazepine (TRILEPTAL) 150 MG tablet TAKE 1 TABLET BY MOUTH TWICE A  DAY Patient taking differently: Take 150 mg by mouth 2 (two) times daily. 04/05/20  Yes Suzzanne Cloud, NP  XARELTO 20 MG TABS tablet Take 20 mg by mouth in the morning. 07/29/20  Yes [provider]  SUMAtriptan (IMITREX) 25 MG tablet Take 1 tablet (25 mg total) by mouth every 2 (two) hours as needed for migraine. May repeat in 2 hours if headache persists or recurs. 03/12/17   Marcial Pacas, MD    Current Facility-Administered Medications  Medication Dose Route Frequency Provider Last Rate Last Admin  . 0.9 %  sodium chloride infusion   Intravenous Continuous Levin Erp, [place]        Allergies as of 09/09/2020 - Review Complete 09/09/2020  Allergen Reaction Noted  . Raspberry Anaphylaxis 08/19/2011  . Cefdinir  04/01/2018  . Percocet [oxycodone-acetaminophen]  08/23/2011    Family History  Problem Relation Age of Onset  . Diabetes Father   . Stroke Father   . Atrial fibrillation Father   . Emphysema Maternal Grandfather        smoked  . Colon cancer Maternal Grandfather   . Atrial fibrillation Mother     Social History   Socioeconomic History  . Marital status: Married    Spouse name: Not on file  . Number of children: 1  . Years of education: Associates  . Highest education level: Not on file  Occupational History  . Occupation: Ship broker  Tobacco Use  . Smoking status: Never Smoker  . Smokeless tobacco: Never Used  Vaping Use  . Vaping Use: Never used  Substance and Sexual Activity  . Alcohol use: No  . Drug use: No  . Sexual activity: Not on file  Other Topics Concern  . Not on file  Social History Narrative   Lives at home with wife and daughter.   Right-handed.   1 cup caffeine daily.   Social Determinants of Health   Financial Resource Strain: Not on file  Food Insecurity: Not on file  Transportation Needs: Not on file  Physical Activity: Not on file  Stress: Not on file  Social Connections: Not on file  Intimate Partner  Violence: Not on file    Review of Systems:  All other review of systems negative except as mentioned in the HPI.  Physical Exam: Vital signs in last 24 hours: Temp:  [97.3 F (36.3 C)] 97.3 F (36.3 C) (05/09 0847) Pulse Rate:  [78] 78 (05/09 0847) Resp:  [17] 17 (05/09 0847) BP: (167)/(68) 167/68 (05/09 0847) SpO2:  [98 %] 98 % (05/09 0847) Weight:  [235.4 kg] 235.4 kg (05/09 0847)   General:   Alert,  Well-developed, obese , pleasant and cooperative  in NAD Lungs:  Clear throughout to auscultation.   Heart:  Regular rate and rhythm; no murmurs, clicks, rubs,  or gallops. Abdomen:  Obese Soft, nontender and nondistended. Normal bowel sounds.   Neuro/Psych:  Alert and cooperative. Normal mood and affect. A and O x 3   @Rayanna Matusik  Simonne Maffucci, MD, [facility] [phone] (pager) 10/11/2020 9:33 AM@

## 2020-10-12 ENCOUNTER — Encounter (HOSPITAL_COMMUNITY): Payer: Self-pay | Admitting: Internal Medicine

## 2020-10-12 LAB — SURGICAL PATHOLOGY

## 2020-10-14 ENCOUNTER — Ambulatory Visit: Payer: PPO | Admitting: Podiatry

## 2020-10-22 ENCOUNTER — Encounter: Payer: Self-pay | Admitting: Internal Medicine

## 2020-10-22 DIAGNOSIS — Z86010 Personal history of colonic polyps: Secondary | ICD-10-CM

## 2020-10-27 DIAGNOSIS — Z6841 Body Mass Index (BMI) 40.0 and over, adult: Secondary | ICD-10-CM | POA: Diagnosis not present

## 2020-10-27 DIAGNOSIS — I82401 Acute embolism and thrombosis of unspecified deep veins of right lower extremity: Secondary | ICD-10-CM | POA: Diagnosis not present

## 2020-10-27 DIAGNOSIS — I4891 Unspecified atrial fibrillation: Secondary | ICD-10-CM | POA: Diagnosis not present

## 2020-10-27 DIAGNOSIS — E039 Hypothyroidism, unspecified: Secondary | ICD-10-CM | POA: Diagnosis not present

## 2020-10-27 DIAGNOSIS — I10 Essential (primary) hypertension: Secondary | ICD-10-CM | POA: Diagnosis not present

## 2020-10-27 DIAGNOSIS — M1712 Unilateral primary osteoarthritis, left knee: Secondary | ICD-10-CM | POA: Diagnosis not present

## 2020-10-27 DIAGNOSIS — K219 Gastro-esophageal reflux disease without esophagitis: Secondary | ICD-10-CM | POA: Diagnosis not present

## 2020-10-27 DIAGNOSIS — G629 Polyneuropathy, unspecified: Secondary | ICD-10-CM | POA: Diagnosis not present

## 2020-10-27 DIAGNOSIS — M1711 Unilateral primary osteoarthritis, right knee: Secondary | ICD-10-CM | POA: Diagnosis not present

## 2020-10-27 DIAGNOSIS — R609 Edema, unspecified: Secondary | ICD-10-CM | POA: Diagnosis not present

## 2020-10-27 DIAGNOSIS — G500 Trigeminal neuralgia: Secondary | ICD-10-CM | POA: Diagnosis not present

## 2020-11-04 DIAGNOSIS — R109 Unspecified abdominal pain: Secondary | ICD-10-CM | POA: Diagnosis not present

## 2020-11-04 DIAGNOSIS — G4733 Obstructive sleep apnea (adult) (pediatric): Secondary | ICD-10-CM | POA: Diagnosis not present

## 2020-11-04 DIAGNOSIS — Z79899 Other long term (current) drug therapy: Secondary | ICD-10-CM | POA: Diagnosis not present

## 2020-11-04 DIAGNOSIS — K219 Gastro-esophageal reflux disease without esophagitis: Secondary | ICD-10-CM | POA: Diagnosis not present

## 2020-11-04 DIAGNOSIS — R945 Abnormal results of liver function studies: Secondary | ICD-10-CM | POA: Diagnosis not present

## 2020-11-04 DIAGNOSIS — R1013 Epigastric pain: Secondary | ICD-10-CM | POA: Diagnosis not present

## 2020-11-04 DIAGNOSIS — Z79890 Hormone replacement therapy (postmenopausal): Secondary | ICD-10-CM | POA: Diagnosis not present

## 2020-11-04 DIAGNOSIS — Z86711 Personal history of pulmonary embolism: Secondary | ICD-10-CM | POA: Diagnosis not present

## 2020-11-04 DIAGNOSIS — I444 Left anterior fascicular block: Secondary | ICD-10-CM | POA: Diagnosis not present

## 2020-11-04 DIAGNOSIS — Z7901 Long term (current) use of anticoagulants: Secondary | ICD-10-CM | POA: Diagnosis not present

## 2020-11-04 DIAGNOSIS — Z20822 Contact with and (suspected) exposure to covid-19: Secondary | ICD-10-CM | POA: Diagnosis not present

## 2020-11-04 DIAGNOSIS — Z6841 Body Mass Index (BMI) 40.0 and over, adult: Secondary | ICD-10-CM | POA: Diagnosis not present

## 2020-11-04 DIAGNOSIS — I4891 Unspecified atrial fibrillation: Secondary | ICD-10-CM | POA: Diagnosis not present

## 2020-11-04 DIAGNOSIS — E119 Type 2 diabetes mellitus without complications: Secondary | ICD-10-CM | POA: Diagnosis not present

## 2020-11-04 DIAGNOSIS — E039 Hypothyroidism, unspecified: Secondary | ICD-10-CM | POA: Diagnosis not present

## 2020-11-04 DIAGNOSIS — I10 Essential (primary) hypertension: Secondary | ICD-10-CM | POA: Diagnosis not present

## 2020-11-04 DIAGNOSIS — K838 Other specified diseases of biliary tract: Secondary | ICD-10-CM | POA: Diagnosis not present

## 2020-11-04 DIAGNOSIS — R079 Chest pain, unspecified: Secondary | ICD-10-CM | POA: Diagnosis not present

## 2020-11-04 DIAGNOSIS — R7989 Other specified abnormal findings of blood chemistry: Secondary | ICD-10-CM | POA: Diagnosis not present

## 2020-11-05 DIAGNOSIS — Z7901 Long term (current) use of anticoagulants: Secondary | ICD-10-CM | POA: Diagnosis not present

## 2020-11-05 DIAGNOSIS — Z86711 Personal history of pulmonary embolism: Secondary | ICD-10-CM | POA: Diagnosis not present

## 2020-11-05 DIAGNOSIS — K219 Gastro-esophageal reflux disease without esophagitis: Secondary | ICD-10-CM | POA: Diagnosis not present

## 2020-11-05 DIAGNOSIS — E039 Hypothyroidism, unspecified: Secondary | ICD-10-CM | POA: Diagnosis not present

## 2020-11-05 DIAGNOSIS — K838 Other specified diseases of biliary tract: Secondary | ICD-10-CM | POA: Diagnosis not present

## 2020-11-05 DIAGNOSIS — Z79899 Other long term (current) drug therapy: Secondary | ICD-10-CM | POA: Diagnosis not present

## 2020-11-05 DIAGNOSIS — Z713 Dietary counseling and surveillance: Secondary | ICD-10-CM | POA: Diagnosis not present

## 2020-11-05 DIAGNOSIS — Z79890 Hormone replacement therapy (postmenopausal): Secondary | ICD-10-CM | POA: Diagnosis not present

## 2020-11-05 DIAGNOSIS — Z9989 Dependence on other enabling machines and devices: Secondary | ICD-10-CM | POA: Diagnosis not present

## 2020-11-05 DIAGNOSIS — I10 Essential (primary) hypertension: Secondary | ICD-10-CM | POA: Diagnosis not present

## 2020-11-05 DIAGNOSIS — G4733 Obstructive sleep apnea (adult) (pediatric): Secondary | ICD-10-CM | POA: Diagnosis not present

## 2021-01-08 DIAGNOSIS — R52 Pain, unspecified: Secondary | ICD-10-CM | POA: Diagnosis not present

## 2021-01-08 DIAGNOSIS — I10 Essential (primary) hypertension: Secondary | ICD-10-CM | POA: Diagnosis not present

## 2021-01-09 DIAGNOSIS — I4891 Unspecified atrial fibrillation: Secondary | ICD-10-CM | POA: Diagnosis not present

## 2021-01-09 DIAGNOSIS — R1110 Vomiting, unspecified: Secondary | ICD-10-CM | POA: Diagnosis not present

## 2021-01-09 DIAGNOSIS — R109 Unspecified abdominal pain: Secondary | ICD-10-CM | POA: Diagnosis not present

## 2021-01-09 DIAGNOSIS — R918 Other nonspecific abnormal finding of lung field: Secondary | ICD-10-CM | POA: Diagnosis not present

## 2021-01-09 DIAGNOSIS — R1010 Upper abdominal pain, unspecified: Secondary | ICD-10-CM | POA: Diagnosis not present

## 2021-01-09 DIAGNOSIS — Z79899 Other long term (current) drug therapy: Secondary | ICD-10-CM | POA: Diagnosis not present

## 2021-01-09 DIAGNOSIS — R197 Diarrhea, unspecified: Secondary | ICD-10-CM | POA: Diagnosis not present

## 2021-01-09 DIAGNOSIS — I10 Essential (primary) hypertension: Secondary | ICD-10-CM | POA: Diagnosis not present

## 2021-01-09 DIAGNOSIS — Z79890 Hormone replacement therapy (postmenopausal): Secondary | ICD-10-CM | POA: Diagnosis not present

## 2021-01-09 DIAGNOSIS — Z9049 Acquired absence of other specified parts of digestive tract: Secondary | ICD-10-CM | POA: Diagnosis not present

## 2021-01-09 DIAGNOSIS — E079 Disorder of thyroid, unspecified: Secondary | ICD-10-CM | POA: Diagnosis not present

## 2021-01-09 DIAGNOSIS — Z881 Allergy status to other antibiotic agents status: Secondary | ICD-10-CM | POA: Diagnosis not present

## 2021-01-09 DIAGNOSIS — Z4682 Encounter for fitting and adjustment of non-vascular catheter: Secondary | ICD-10-CM | POA: Diagnosis not present

## 2021-01-09 DIAGNOSIS — Z20822 Contact with and (suspected) exposure to covid-19: Secondary | ICD-10-CM | POA: Diagnosis not present

## 2021-01-09 DIAGNOSIS — R140 Abdominal distension (gaseous): Secondary | ICD-10-CM | POA: Diagnosis not present

## 2021-01-09 DIAGNOSIS — Z91018 Allergy to other foods: Secondary | ICD-10-CM | POA: Diagnosis not present

## 2021-01-09 DIAGNOSIS — Z7901 Long term (current) use of anticoagulants: Secondary | ICD-10-CM | POA: Diagnosis not present

## 2021-01-13 DIAGNOSIS — R001 Bradycardia, unspecified: Secondary | ICD-10-CM | POA: Diagnosis not present

## 2021-01-13 DIAGNOSIS — R197 Diarrhea, unspecified: Secondary | ICD-10-CM | POA: Diagnosis not present

## 2021-01-13 DIAGNOSIS — R0602 Shortness of breath: Secondary | ICD-10-CM | POA: Diagnosis not present

## 2021-01-13 DIAGNOSIS — I440 Atrioventricular block, first degree: Secondary | ICD-10-CM | POA: Diagnosis not present

## 2021-01-13 DIAGNOSIS — I10 Essential (primary) hypertension: Secondary | ICD-10-CM | POA: Diagnosis not present

## 2021-01-13 DIAGNOSIS — E079 Disorder of thyroid, unspecified: Secondary | ICD-10-CM | POA: Diagnosis not present

## 2021-01-13 DIAGNOSIS — I517 Cardiomegaly: Secondary | ICD-10-CM | POA: Diagnosis not present

## 2021-01-13 DIAGNOSIS — R9431 Abnormal electrocardiogram [ECG] [EKG]: Secondary | ICD-10-CM | POA: Diagnosis not present

## 2021-01-13 DIAGNOSIS — Z881 Allergy status to other antibiotic agents status: Secondary | ICD-10-CM | POA: Diagnosis not present

## 2021-01-13 DIAGNOSIS — R079 Chest pain, unspecified: Secondary | ICD-10-CM | POA: Diagnosis not present

## 2021-01-13 DIAGNOSIS — Z79899 Other long term (current) drug therapy: Secondary | ICD-10-CM | POA: Diagnosis not present

## 2021-01-13 DIAGNOSIS — I4891 Unspecified atrial fibrillation: Secondary | ICD-10-CM | POA: Diagnosis not present

## 2021-01-13 DIAGNOSIS — Z9049 Acquired absence of other specified parts of digestive tract: Secondary | ICD-10-CM | POA: Diagnosis not present

## 2021-01-13 DIAGNOSIS — R600 Localized edema: Secondary | ICD-10-CM | POA: Diagnosis not present

## 2021-01-13 DIAGNOSIS — Z20822 Contact with and (suspected) exposure to covid-19: Secondary | ICD-10-CM | POA: Diagnosis not present

## 2021-01-13 DIAGNOSIS — R0600 Dyspnea, unspecified: Secondary | ICD-10-CM | POA: Diagnosis not present

## 2021-01-13 DIAGNOSIS — Z79890 Hormone replacement therapy (postmenopausal): Secondary | ICD-10-CM | POA: Diagnosis not present

## 2021-02-01 DIAGNOSIS — R609 Edema, unspecified: Secondary | ICD-10-CM | POA: Diagnosis not present

## 2021-02-01 DIAGNOSIS — E039 Hypothyroidism, unspecified: Secondary | ICD-10-CM | POA: Diagnosis not present

## 2021-02-01 DIAGNOSIS — G629 Polyneuropathy, unspecified: Secondary | ICD-10-CM | POA: Diagnosis not present

## 2021-02-01 DIAGNOSIS — Z79899 Other long term (current) drug therapy: Secondary | ICD-10-CM | POA: Diagnosis not present

## 2021-02-01 DIAGNOSIS — K219 Gastro-esophageal reflux disease without esophagitis: Secondary | ICD-10-CM | POA: Diagnosis not present

## 2021-02-01 DIAGNOSIS — M1712 Unilateral primary osteoarthritis, left knee: Secondary | ICD-10-CM | POA: Diagnosis not present

## 2021-02-01 DIAGNOSIS — G500 Trigeminal neuralgia: Secondary | ICD-10-CM | POA: Diagnosis not present

## 2021-02-01 DIAGNOSIS — I10 Essential (primary) hypertension: Secondary | ICD-10-CM | POA: Diagnosis not present

## 2021-02-01 DIAGNOSIS — Z6841 Body Mass Index (BMI) 40.0 and over, adult: Secondary | ICD-10-CM | POA: Diagnosis not present

## 2021-02-01 DIAGNOSIS — I82401 Acute embolism and thrombosis of unspecified deep veins of right lower extremity: Secondary | ICD-10-CM | POA: Diagnosis not present

## 2021-02-01 DIAGNOSIS — M1711 Unilateral primary osteoarthritis, right knee: Secondary | ICD-10-CM | POA: Diagnosis not present

## 2021-02-01 DIAGNOSIS — I4891 Unspecified atrial fibrillation: Secondary | ICD-10-CM | POA: Diagnosis not present

## 2021-02-17 ENCOUNTER — Ambulatory Visit: Payer: PPO | Admitting: Student in an Organized Health Care Education/Training Program

## 2021-03-17 DIAGNOSIS — I4891 Unspecified atrial fibrillation: Secondary | ICD-10-CM | POA: Diagnosis not present

## 2021-03-23 DIAGNOSIS — I4891 Unspecified atrial fibrillation: Secondary | ICD-10-CM | POA: Diagnosis not present

## 2021-05-04 DIAGNOSIS — I4891 Unspecified atrial fibrillation: Secondary | ICD-10-CM | POA: Diagnosis not present

## 2021-05-04 DIAGNOSIS — E039 Hypothyroidism, unspecified: Secondary | ICD-10-CM | POA: Diagnosis not present

## 2021-05-04 DIAGNOSIS — M1711 Unilateral primary osteoarthritis, right knee: Secondary | ICD-10-CM | POA: Diagnosis not present

## 2021-05-04 DIAGNOSIS — M1712 Unilateral primary osteoarthritis, left knee: Secondary | ICD-10-CM | POA: Diagnosis not present

## 2021-05-04 DIAGNOSIS — I10 Essential (primary) hypertension: Secondary | ICD-10-CM | POA: Diagnosis not present

## 2021-05-04 DIAGNOSIS — Z86718 Personal history of other venous thrombosis and embolism: Secondary | ICD-10-CM | POA: Diagnosis not present

## 2021-05-04 DIAGNOSIS — G500 Trigeminal neuralgia: Secondary | ICD-10-CM | POA: Diagnosis not present

## 2021-05-04 DIAGNOSIS — K219 Gastro-esophageal reflux disease without esophagitis: Secondary | ICD-10-CM | POA: Diagnosis not present

## 2021-05-04 DIAGNOSIS — Z6841 Body Mass Index (BMI) 40.0 and over, adult: Secondary | ICD-10-CM | POA: Diagnosis not present

## 2021-05-04 DIAGNOSIS — Z23 Encounter for immunization: Secondary | ICD-10-CM | POA: Diagnosis not present

## 2021-05-04 DIAGNOSIS — G629 Polyneuropathy, unspecified: Secondary | ICD-10-CM | POA: Diagnosis not present

## 2021-05-04 DIAGNOSIS — R609 Edema, unspecified: Secondary | ICD-10-CM | POA: Diagnosis not present

## 2021-05-06 DIAGNOSIS — G4733 Obstructive sleep apnea (adult) (pediatric): Secondary | ICD-10-CM | POA: Diagnosis not present

## 2021-06-14 DIAGNOSIS — G4733 Obstructive sleep apnea (adult) (pediatric): Secondary | ICD-10-CM | POA: Diagnosis not present

## 2021-06-28 DIAGNOSIS — M545 Low back pain, unspecified: Secondary | ICD-10-CM | POA: Diagnosis not present

## 2021-06-28 DIAGNOSIS — M170 Bilateral primary osteoarthritis of knee: Secondary | ICD-10-CM | POA: Diagnosis not present

## 2021-06-28 DIAGNOSIS — G8929 Other chronic pain: Secondary | ICD-10-CM | POA: Diagnosis not present

## 2021-06-28 DIAGNOSIS — Z0189 Encounter for other specified special examinations: Secondary | ICD-10-CM | POA: Diagnosis not present

## 2021-06-28 DIAGNOSIS — Z79891 Long term (current) use of opiate analgesic: Secondary | ICD-10-CM | POA: Diagnosis not present

## 2021-06-28 DIAGNOSIS — Z6841 Body Mass Index (BMI) 40.0 and over, adult: Secondary | ICD-10-CM | POA: Diagnosis not present

## 2021-08-08 DIAGNOSIS — R609 Edema, unspecified: Secondary | ICD-10-CM | POA: Diagnosis not present

## 2021-08-08 DIAGNOSIS — Z6841 Body Mass Index (BMI) 40.0 and over, adult: Secondary | ICD-10-CM | POA: Diagnosis not present

## 2021-08-08 DIAGNOSIS — I4891 Unspecified atrial fibrillation: Secondary | ICD-10-CM | POA: Diagnosis not present

## 2021-08-08 DIAGNOSIS — Z86718 Personal history of other venous thrombosis and embolism: Secondary | ICD-10-CM | POA: Diagnosis not present

## 2021-08-08 DIAGNOSIS — K219 Gastro-esophageal reflux disease without esophagitis: Secondary | ICD-10-CM | POA: Diagnosis not present

## 2021-08-08 DIAGNOSIS — I10 Essential (primary) hypertension: Secondary | ICD-10-CM | POA: Diagnosis not present

## 2021-08-08 DIAGNOSIS — M1711 Unilateral primary osteoarthritis, right knee: Secondary | ICD-10-CM | POA: Diagnosis not present

## 2021-08-08 DIAGNOSIS — G2581 Restless legs syndrome: Secondary | ICD-10-CM | POA: Diagnosis not present

## 2021-08-08 DIAGNOSIS — M1712 Unilateral primary osteoarthritis, left knee: Secondary | ICD-10-CM | POA: Diagnosis not present

## 2021-08-08 DIAGNOSIS — G500 Trigeminal neuralgia: Secondary | ICD-10-CM | POA: Diagnosis not present

## 2021-08-08 DIAGNOSIS — G629 Polyneuropathy, unspecified: Secondary | ICD-10-CM | POA: Diagnosis not present

## 2021-08-08 DIAGNOSIS — E039 Hypothyroidism, unspecified: Secondary | ICD-10-CM | POA: Diagnosis not present

## 2021-08-15 DIAGNOSIS — Z6841 Body Mass Index (BMI) 40.0 and over, adult: Secondary | ICD-10-CM | POA: Diagnosis not present

## 2021-09-13 DIAGNOSIS — G8929 Other chronic pain: Secondary | ICD-10-CM | POA: Diagnosis not present

## 2021-09-13 DIAGNOSIS — Z5181 Encounter for therapeutic drug level monitoring: Secondary | ICD-10-CM | POA: Diagnosis not present

## 2021-09-13 DIAGNOSIS — M5137 Other intervertebral disc degeneration, lumbosacral region: Secondary | ICD-10-CM | POA: Diagnosis not present

## 2021-09-13 DIAGNOSIS — Z760 Encounter for issue of repeat prescription: Secondary | ICD-10-CM | POA: Diagnosis not present

## 2021-09-13 DIAGNOSIS — M170 Bilateral primary osteoarthritis of knee: Secondary | ICD-10-CM | POA: Diagnosis not present

## 2021-09-13 DIAGNOSIS — Z6841 Body Mass Index (BMI) 40.0 and over, adult: Secondary | ICD-10-CM | POA: Diagnosis not present

## 2021-09-13 DIAGNOSIS — Z79891 Long term (current) use of opiate analgesic: Secondary | ICD-10-CM | POA: Diagnosis not present

## 2021-09-16 DIAGNOSIS — R7309 Other abnormal glucose: Secondary | ICD-10-CM | POA: Diagnosis not present

## 2021-09-22 DIAGNOSIS — Z86718 Personal history of other venous thrombosis and embolism: Secondary | ICD-10-CM | POA: Diagnosis not present

## 2021-09-22 DIAGNOSIS — Z7901 Long term (current) use of anticoagulants: Secondary | ICD-10-CM | POA: Diagnosis not present

## 2021-09-22 DIAGNOSIS — Z6841 Body Mass Index (BMI) 40.0 and over, adult: Secondary | ICD-10-CM | POA: Diagnosis not present

## 2021-09-22 DIAGNOSIS — Z881 Allergy status to other antibiotic agents status: Secondary | ICD-10-CM | POA: Diagnosis not present

## 2021-09-22 DIAGNOSIS — R9431 Abnormal electrocardiogram [ECG] [EKG]: Secondary | ICD-10-CM | POA: Diagnosis not present

## 2021-09-22 DIAGNOSIS — Z91018 Allergy to other foods: Secondary | ICD-10-CM | POA: Diagnosis not present

## 2021-09-22 DIAGNOSIS — I491 Atrial premature depolarization: Secondary | ICD-10-CM | POA: Diagnosis not present

## 2021-09-22 DIAGNOSIS — Z79899 Other long term (current) drug therapy: Secondary | ICD-10-CM | POA: Diagnosis not present

## 2021-09-22 DIAGNOSIS — Z79890 Hormone replacement therapy (postmenopausal): Secondary | ICD-10-CM | POA: Diagnosis not present

## 2021-09-22 DIAGNOSIS — Z86711 Personal history of pulmonary embolism: Secondary | ICD-10-CM | POA: Diagnosis not present

## 2021-09-22 DIAGNOSIS — I10 Essential (primary) hypertension: Secondary | ICD-10-CM | POA: Diagnosis not present

## 2021-09-22 DIAGNOSIS — I440 Atrioventricular block, first degree: Secondary | ICD-10-CM | POA: Diagnosis not present

## 2021-09-22 DIAGNOSIS — Z823 Family history of stroke: Secondary | ICD-10-CM | POA: Diagnosis not present

## 2021-09-22 DIAGNOSIS — Z9049 Acquired absence of other specified parts of digestive tract: Secondary | ICD-10-CM | POA: Diagnosis not present

## 2021-09-22 DIAGNOSIS — G4733 Obstructive sleep apnea (adult) (pediatric): Secondary | ICD-10-CM | POA: Diagnosis not present

## 2021-09-22 DIAGNOSIS — I4891 Unspecified atrial fibrillation: Secondary | ICD-10-CM | POA: Diagnosis not present

## 2021-09-22 DIAGNOSIS — I4819 Other persistent atrial fibrillation: Secondary | ICD-10-CM | POA: Diagnosis not present

## 2021-09-22 DIAGNOSIS — M1710 Unilateral primary osteoarthritis, unspecified knee: Secondary | ICD-10-CM | POA: Diagnosis not present

## 2021-09-22 DIAGNOSIS — E079 Disorder of thyroid, unspecified: Secondary | ICD-10-CM | POA: Diagnosis not present

## 2021-09-22 DIAGNOSIS — I444 Left anterior fascicular block: Secondary | ICD-10-CM | POA: Diagnosis not present

## 2021-09-22 DIAGNOSIS — Z885 Allergy status to narcotic agent status: Secondary | ICD-10-CM | POA: Diagnosis not present

## 2021-09-23 DIAGNOSIS — I4891 Unspecified atrial fibrillation: Secondary | ICD-10-CM | POA: Diagnosis not present

## 2021-10-02 DIAGNOSIS — E039 Hypothyroidism, unspecified: Secondary | ICD-10-CM | POA: Diagnosis not present

## 2021-10-02 DIAGNOSIS — I10 Essential (primary) hypertension: Secondary | ICD-10-CM | POA: Diagnosis not present

## 2021-10-20 ENCOUNTER — Encounter: Payer: Self-pay | Admitting: Podiatry

## 2021-10-20 ENCOUNTER — Ambulatory Visit: Payer: PPO | Admitting: Podiatry

## 2021-10-20 DIAGNOSIS — D689 Coagulation defect, unspecified: Secondary | ICD-10-CM

## 2021-10-20 DIAGNOSIS — M79676 Pain in unspecified toe(s): Secondary | ICD-10-CM | POA: Diagnosis not present

## 2021-10-20 DIAGNOSIS — B351 Tinea unguium: Secondary | ICD-10-CM | POA: Diagnosis not present

## 2021-10-20 NOTE — Progress Notes (Signed)
This patient returns to my office for at risk foot care.  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  This patient is unable to cut nails himself since the patient cannot reach his nails.These nails are painful walking and wearing shoes.  This patient presents for at risk foot care today.  General Appearance  Alert, conversant and in no acute stress.  Vascular  Dorsalis pedis and posterior tibial  pulses are  not palpable due to swelling  B/L.  bilaterally.  Capillary return is within normal limits  bilaterally. Temperature is within normal limits  bilaterally.  Venous stasis.  Neurologic  Senn-Weinstein monofilament wire test within normal limits  bilaterally. Muscle power within normal limits bilaterally.  Nails Thick disfigured discolored nails with subungual debris  from hallux to fifth toes bilaterally. No evidence of bacterial infection or drainage bilaterally.  Orthopedic  No limitations of motion  feet .  No crepitus or effusions noted.  No bony pathology or digital deformities noted.  Skin  normotropic skin with no porokeratosis noted bilaterally.  No signs of infections or ulcers noted.     Onychomycosis  Pain in right toes  Pain in left toes  Consent was obtained for treatment procedures.   Mechanical debridement of nails 1-5  bilaterally performed with a nail nipper.  Filed with dremel without incident.    Return office visit    3 months                 Told patient to return for periodic foot care and evaluation due to potential at risk complications.   Dima Mini DPM   

## 2021-11-08 DIAGNOSIS — E119 Type 2 diabetes mellitus without complications: Secondary | ICD-10-CM | POA: Diagnosis not present

## 2021-11-08 DIAGNOSIS — I4891 Unspecified atrial fibrillation: Secondary | ICD-10-CM | POA: Diagnosis not present

## 2021-11-08 DIAGNOSIS — G500 Trigeminal neuralgia: Secondary | ICD-10-CM | POA: Diagnosis not present

## 2021-11-08 DIAGNOSIS — R609 Edema, unspecified: Secondary | ICD-10-CM | POA: Diagnosis not present

## 2021-11-08 DIAGNOSIS — G629 Polyneuropathy, unspecified: Secondary | ICD-10-CM | POA: Diagnosis not present

## 2021-11-08 DIAGNOSIS — Z86718 Personal history of other venous thrombosis and embolism: Secondary | ICD-10-CM | POA: Diagnosis not present

## 2021-11-08 DIAGNOSIS — M1711 Unilateral primary osteoarthritis, right knee: Secondary | ICD-10-CM | POA: Diagnosis not present

## 2021-11-08 DIAGNOSIS — K219 Gastro-esophageal reflux disease without esophagitis: Secondary | ICD-10-CM | POA: Diagnosis not present

## 2021-11-08 DIAGNOSIS — G2581 Restless legs syndrome: Secondary | ICD-10-CM | POA: Diagnosis not present

## 2021-11-08 DIAGNOSIS — M1712 Unilateral primary osteoarthritis, left knee: Secondary | ICD-10-CM | POA: Diagnosis not present

## 2021-11-08 DIAGNOSIS — I10 Essential (primary) hypertension: Secondary | ICD-10-CM | POA: Diagnosis not present

## 2021-11-08 DIAGNOSIS — E039 Hypothyroidism, unspecified: Secondary | ICD-10-CM | POA: Diagnosis not present

## 2022-01-02 DIAGNOSIS — I4891 Unspecified atrial fibrillation: Secondary | ICD-10-CM | POA: Diagnosis not present

## 2022-01-02 DIAGNOSIS — E119 Type 2 diabetes mellitus without complications: Secondary | ICD-10-CM | POA: Diagnosis not present

## 2022-01-02 DIAGNOSIS — E039 Hypothyroidism, unspecified: Secondary | ICD-10-CM | POA: Diagnosis not present

## 2022-01-04 DIAGNOSIS — R590 Localized enlarged lymph nodes: Secondary | ICD-10-CM | POA: Diagnosis not present

## 2022-01-04 DIAGNOSIS — E119 Type 2 diabetes mellitus without complications: Secondary | ICD-10-CM | POA: Diagnosis not present

## 2022-01-19 ENCOUNTER — Ambulatory Visit: Payer: PPO | Admitting: Podiatry

## 2022-02-08 DIAGNOSIS — R609 Edema, unspecified: Secondary | ICD-10-CM | POA: Diagnosis not present

## 2022-02-08 DIAGNOSIS — G629 Polyneuropathy, unspecified: Secondary | ICD-10-CM | POA: Diagnosis not present

## 2022-02-08 DIAGNOSIS — G2581 Restless legs syndrome: Secondary | ICD-10-CM | POA: Diagnosis not present

## 2022-02-08 DIAGNOSIS — M1711 Unilateral primary osteoarthritis, right knee: Secondary | ICD-10-CM | POA: Diagnosis not present

## 2022-02-08 DIAGNOSIS — I10 Essential (primary) hypertension: Secondary | ICD-10-CM | POA: Diagnosis not present

## 2022-02-08 DIAGNOSIS — K219 Gastro-esophageal reflux disease without esophagitis: Secondary | ICD-10-CM | POA: Diagnosis not present

## 2022-02-08 DIAGNOSIS — M1712 Unilateral primary osteoarthritis, left knee: Secondary | ICD-10-CM | POA: Diagnosis not present

## 2022-02-08 DIAGNOSIS — G500 Trigeminal neuralgia: Secondary | ICD-10-CM | POA: Diagnosis not present

## 2022-02-08 DIAGNOSIS — I4891 Unspecified atrial fibrillation: Secondary | ICD-10-CM | POA: Diagnosis not present

## 2022-02-08 DIAGNOSIS — Z86718 Personal history of other venous thrombosis and embolism: Secondary | ICD-10-CM | POA: Diagnosis not present

## 2022-02-08 DIAGNOSIS — E119 Type 2 diabetes mellitus without complications: Secondary | ICD-10-CM | POA: Diagnosis not present

## 2022-02-08 DIAGNOSIS — E039 Hypothyroidism, unspecified: Secondary | ICD-10-CM | POA: Diagnosis not present

## 2022-02-16 ENCOUNTER — Ambulatory Visit: Payer: PPO | Admitting: Podiatry

## 2022-02-17 DIAGNOSIS — L03116 Cellulitis of left lower limb: Secondary | ICD-10-CM | POA: Diagnosis not present

## 2022-02-20 DIAGNOSIS — S91109A Unspecified open wound of unspecified toe(s) without damage to nail, initial encounter: Secondary | ICD-10-CM | POA: Diagnosis not present

## 2022-02-20 DIAGNOSIS — L03116 Cellulitis of left lower limb: Secondary | ICD-10-CM | POA: Diagnosis not present

## 2022-02-23 DIAGNOSIS — L03116 Cellulitis of left lower limb: Secondary | ICD-10-CM | POA: Diagnosis not present

## 2022-02-23 DIAGNOSIS — S91109A Unspecified open wound of unspecified toe(s) without damage to nail, initial encounter: Secondary | ICD-10-CM | POA: Diagnosis not present

## 2022-03-06 DIAGNOSIS — S91109A Unspecified open wound of unspecified toe(s) without damage to nail, initial encounter: Secondary | ICD-10-CM | POA: Diagnosis not present

## 2022-03-06 DIAGNOSIS — G2581 Restless legs syndrome: Secondary | ICD-10-CM | POA: Diagnosis not present

## 2022-03-06 DIAGNOSIS — E119 Type 2 diabetes mellitus without complications: Secondary | ICD-10-CM | POA: Diagnosis not present

## 2022-03-09 DIAGNOSIS — R748 Abnormal levels of other serum enzymes: Secondary | ICD-10-CM | POA: Diagnosis not present

## 2022-03-13 ENCOUNTER — Other Ambulatory Visit: Payer: Self-pay | Admitting: Physician Assistant

## 2022-03-13 ENCOUNTER — Other Ambulatory Visit (HOSPITAL_COMMUNITY): Payer: Self-pay | Admitting: Physician Assistant

## 2022-03-13 DIAGNOSIS — R748 Abnormal levels of other serum enzymes: Secondary | ICD-10-CM

## 2022-03-20 DIAGNOSIS — L03116 Cellulitis of left lower limb: Secondary | ICD-10-CM | POA: Diagnosis not present

## 2022-03-23 DIAGNOSIS — Z79899 Other long term (current) drug therapy: Secondary | ICD-10-CM | POA: Diagnosis not present

## 2022-03-23 DIAGNOSIS — I4891 Unspecified atrial fibrillation: Secondary | ICD-10-CM | POA: Diagnosis not present

## 2022-03-24 ENCOUNTER — Ambulatory Visit
Admission: RE | Admit: 2022-03-24 | Discharge: 2022-03-24 | Disposition: A | Discharge status: Home / Self Care | Payer: PPO | Source: Ambulatory Visit | Attending: Physician Assistant | Admitting: Physician Assistant

## 2022-03-24 DIAGNOSIS — R748 Abnormal levels of other serum enzymes: Secondary | ICD-10-CM | POA: Insufficient documentation

## 2022-03-24 DIAGNOSIS — Z9049 Acquired absence of other specified parts of digestive tract: Secondary | ICD-10-CM | POA: Diagnosis not present

## 2022-03-24 DIAGNOSIS — R945 Abnormal results of liver function studies: Secondary | ICD-10-CM | POA: Diagnosis not present

## 2022-04-10 DIAGNOSIS — L03116 Cellulitis of left lower limb: Secondary | ICD-10-CM | POA: Diagnosis not present

## 2022-04-11 ENCOUNTER — Other Ambulatory Visit: Payer: Self-pay | Admitting: Physician Assistant

## 2022-04-11 DIAGNOSIS — R748 Abnormal levels of other serum enzymes: Secondary | ICD-10-CM

## 2022-05-02 DIAGNOSIS — R590 Localized enlarged lymph nodes: Secondary | ICD-10-CM | POA: Diagnosis not present

## 2022-05-08 DIAGNOSIS — M25572 Pain in left ankle and joints of left foot: Secondary | ICD-10-CM | POA: Diagnosis not present

## 2022-05-15 DIAGNOSIS — R609 Edema, unspecified: Secondary | ICD-10-CM | POA: Diagnosis not present

## 2022-05-15 DIAGNOSIS — M1712 Unilateral primary osteoarthritis, left knee: Secondary | ICD-10-CM | POA: Diagnosis not present

## 2022-05-15 DIAGNOSIS — G629 Polyneuropathy, unspecified: Secondary | ICD-10-CM | POA: Diagnosis not present

## 2022-05-15 DIAGNOSIS — K219 Gastro-esophageal reflux disease without esophagitis: Secondary | ICD-10-CM | POA: Diagnosis not present

## 2022-05-15 DIAGNOSIS — E119 Type 2 diabetes mellitus without complications: Secondary | ICD-10-CM | POA: Diagnosis not present

## 2022-05-15 DIAGNOSIS — I4891 Unspecified atrial fibrillation: Secondary | ICD-10-CM | POA: Diagnosis not present

## 2022-05-15 DIAGNOSIS — I10 Essential (primary) hypertension: Secondary | ICD-10-CM | POA: Diagnosis not present

## 2022-05-15 DIAGNOSIS — M1711 Unilateral primary osteoarthritis, right knee: Secondary | ICD-10-CM | POA: Diagnosis not present

## 2022-05-15 DIAGNOSIS — E039 Hypothyroidism, unspecified: Secondary | ICD-10-CM | POA: Diagnosis not present

## 2022-05-15 DIAGNOSIS — Z86718 Personal history of other venous thrombosis and embolism: Secondary | ICD-10-CM | POA: Diagnosis not present

## 2022-05-15 DIAGNOSIS — Z23 Encounter for immunization: Secondary | ICD-10-CM | POA: Diagnosis not present

## 2022-05-15 DIAGNOSIS — G500 Trigeminal neuralgia: Secondary | ICD-10-CM | POA: Diagnosis not present

## 2022-05-18 ENCOUNTER — Ambulatory Visit: Payer: PPO | Admitting: Podiatry

## 2022-05-18 DIAGNOSIS — R221 Localized swelling, mass and lump, neck: Secondary | ICD-10-CM | POA: Diagnosis not present

## 2022-05-19 DIAGNOSIS — M25572 Pain in left ankle and joints of left foot: Secondary | ICD-10-CM | POA: Diagnosis not present

## 2022-05-25 DIAGNOSIS — M25572 Pain in left ankle and joints of left foot: Secondary | ICD-10-CM | POA: Diagnosis not present

## 2022-05-25 DIAGNOSIS — R6889 Other general symptoms and signs: Secondary | ICD-10-CM | POA: Diagnosis not present

## 2022-05-25 DIAGNOSIS — Z20822 Contact with and (suspected) exposure to covid-19: Secondary | ICD-10-CM | POA: Diagnosis not present

## 2022-05-31 DIAGNOSIS — L03116 Cellulitis of left lower limb: Secondary | ICD-10-CM | POA: Diagnosis not present

## 2022-06-05 DIAGNOSIS — G629 Polyneuropathy, unspecified: Secondary | ICD-10-CM | POA: Diagnosis not present

## 2022-06-05 DIAGNOSIS — Z86711 Personal history of pulmonary embolism: Secondary | ICD-10-CM | POA: Diagnosis not present

## 2022-06-05 DIAGNOSIS — Z7901 Long term (current) use of anticoagulants: Secondary | ICD-10-CM | POA: Diagnosis not present

## 2022-06-05 DIAGNOSIS — Z9981 Dependence on supplemental oxygen: Secondary | ICD-10-CM | POA: Diagnosis not present

## 2022-06-05 DIAGNOSIS — G4733 Obstructive sleep apnea (adult) (pediatric): Secondary | ICD-10-CM | POA: Diagnosis not present

## 2022-06-05 DIAGNOSIS — Z6841 Body Mass Index (BMI) 40.0 and over, adult: Secondary | ICD-10-CM | POA: Diagnosis not present

## 2022-06-05 DIAGNOSIS — I10 Essential (primary) hypertension: Secondary | ICD-10-CM | POA: Diagnosis not present

## 2022-06-05 DIAGNOSIS — Z9989 Dependence on other enabling machines and devices: Secondary | ICD-10-CM | POA: Diagnosis not present

## 2022-06-05 DIAGNOSIS — I4891 Unspecified atrial fibrillation: Secondary | ICD-10-CM | POA: Diagnosis not present

## 2022-06-05 DIAGNOSIS — E039 Hypothyroidism, unspecified: Secondary | ICD-10-CM | POA: Diagnosis not present

## 2022-06-05 DIAGNOSIS — B351 Tinea unguium: Secondary | ICD-10-CM | POA: Diagnosis not present

## 2022-06-05 DIAGNOSIS — L03116 Cellulitis of left lower limb: Secondary | ICD-10-CM | POA: Diagnosis not present

## 2022-06-05 DIAGNOSIS — Z79899 Other long term (current) drug therapy: Secondary | ICD-10-CM | POA: Diagnosis not present

## 2022-06-05 DIAGNOSIS — I872 Venous insufficiency (chronic) (peripheral): Secondary | ICD-10-CM | POA: Diagnosis not present

## 2022-06-05 DIAGNOSIS — M7989 Other specified soft tissue disorders: Secondary | ICD-10-CM | POA: Diagnosis not present

## 2022-06-05 DIAGNOSIS — I2699 Other pulmonary embolism without acute cor pulmonale: Secondary | ICD-10-CM | POA: Diagnosis not present

## 2022-07-01 DIAGNOSIS — N390 Urinary tract infection, site not specified: Secondary | ICD-10-CM | POA: Diagnosis not present

## 2022-07-01 DIAGNOSIS — R300 Dysuria: Secondary | ICD-10-CM | POA: Diagnosis not present

## 2022-07-04 DIAGNOSIS — R221 Localized swelling, mass and lump, neck: Secondary | ICD-10-CM | POA: Diagnosis not present

## 2022-07-04 DIAGNOSIS — B351 Tinea unguium: Secondary | ICD-10-CM | POA: Diagnosis not present

## 2022-07-04 DIAGNOSIS — R609 Edema, unspecified: Secondary | ICD-10-CM | POA: Diagnosis not present

## 2022-07-04 DIAGNOSIS — E119 Type 2 diabetes mellitus without complications: Secondary | ICD-10-CM | POA: Diagnosis not present

## 2022-07-04 DIAGNOSIS — M170 Bilateral primary osteoarthritis of knee: Secondary | ICD-10-CM | POA: Diagnosis not present

## 2022-07-04 DIAGNOSIS — L03116 Cellulitis of left lower limb: Secondary | ICD-10-CM | POA: Diagnosis not present

## 2022-07-26 DIAGNOSIS — M170 Bilateral primary osteoarthritis of knee: Secondary | ICD-10-CM | POA: Diagnosis not present

## 2022-07-26 DIAGNOSIS — M545 Low back pain, unspecified: Secondary | ICD-10-CM | POA: Diagnosis not present

## 2022-07-26 DIAGNOSIS — Z5181 Encounter for therapeutic drug level monitoring: Secondary | ICD-10-CM | POA: Diagnosis not present

## 2022-07-26 DIAGNOSIS — Z6841 Body Mass Index (BMI) 40.0 and over, adult: Secondary | ICD-10-CM | POA: Diagnosis not present

## 2022-07-26 DIAGNOSIS — Z760 Encounter for issue of repeat prescription: Secondary | ICD-10-CM | POA: Diagnosis not present

## 2022-07-26 DIAGNOSIS — G8929 Other chronic pain: Secondary | ICD-10-CM | POA: Diagnosis not present

## 2022-07-26 DIAGNOSIS — Z79891 Long term (current) use of opiate analgesic: Secondary | ICD-10-CM | POA: Diagnosis not present

## 2022-08-03 ENCOUNTER — Encounter: Payer: Self-pay | Admitting: Podiatry

## 2022-08-03 ENCOUNTER — Ambulatory Visit: Payer: PPO | Admitting: Podiatry

## 2022-08-03 VITALS — BP 177/70 | HR 53

## 2022-08-03 DIAGNOSIS — M79676 Pain in unspecified toe(s): Secondary | ICD-10-CM

## 2022-08-03 DIAGNOSIS — D689 Coagulation defect, unspecified: Secondary | ICD-10-CM | POA: Diagnosis not present

## 2022-08-03 DIAGNOSIS — B351 Tinea unguium: Secondary | ICD-10-CM

## 2022-08-03 DIAGNOSIS — I10 Essential (primary) hypertension: Secondary | ICD-10-CM | POA: Diagnosis not present

## 2022-08-03 DIAGNOSIS — E119 Type 2 diabetes mellitus without complications: Secondary | ICD-10-CM | POA: Diagnosis not present

## 2022-08-03 DIAGNOSIS — I4891 Unspecified atrial fibrillation: Secondary | ICD-10-CM | POA: Diagnosis not present

## 2022-08-03 DIAGNOSIS — E039 Hypothyroidism, unspecified: Secondary | ICD-10-CM | POA: Diagnosis not present

## 2022-08-03 NOTE — Progress Notes (Signed)
This patient returns to my office for at risk foot care.  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  This patient is unable to cut nails himself since the patient cannot reach his nails.These nails are painful walking and wearing shoes.  This patient presents for at risk foot care today.  General Appearance  Alert, conversant and in no acute stress.  Vascular  Dorsalis pedis and posterior tibial  pulses are  not palpable due to swelling  B/L.  bilaterally.  Capillary return is within normal limits  bilaterally. Temperature is within normal limits  bilaterally.  Venous stasis.  Neurologic  Senn-Weinstein monofilament wire test within normal limits  bilaterally. Muscle power within normal limits bilaterally.  Nails Thick disfigured discolored nails with subungual debris  from hallux to fifth toes bilaterally. No evidence of bacterial infection or drainage bilaterally.  Orthopedic  No limitations of motion  feet .  No crepitus or effusions noted.  No bony pathology or digital deformities noted.  Skin  normotropic skin with no porokeratosis noted bilaterally.  No signs of infections or ulcers noted.     Onychomycosis  Pain in right toes  Pain in left toes  Consent was obtained for treatment procedures.   Mechanical debridement of nails 1-5  bilaterally performed with a nail nipper.  Filed with dremel without incident.    Return office visit    3 months                 Told patient to return for periodic foot care and evaluation due to potential at risk complications.   Gardiner Barefoot DPM

## 2022-08-22 DIAGNOSIS — I4891 Unspecified atrial fibrillation: Secondary | ICD-10-CM | POA: Diagnosis not present

## 2022-08-22 DIAGNOSIS — R609 Edema, unspecified: Secondary | ICD-10-CM | POA: Diagnosis not present

## 2022-08-22 DIAGNOSIS — G2581 Restless legs syndrome: Secondary | ICD-10-CM | POA: Diagnosis not present

## 2022-08-22 DIAGNOSIS — I10 Essential (primary) hypertension: Secondary | ICD-10-CM | POA: Diagnosis not present

## 2022-08-22 DIAGNOSIS — G629 Polyneuropathy, unspecified: Secondary | ICD-10-CM | POA: Diagnosis not present

## 2022-08-22 DIAGNOSIS — K219 Gastro-esophageal reflux disease without esophagitis: Secondary | ICD-10-CM | POA: Diagnosis not present

## 2022-08-22 DIAGNOSIS — Z86718 Personal history of other venous thrombosis and embolism: Secondary | ICD-10-CM | POA: Diagnosis not present

## 2022-08-22 DIAGNOSIS — Z125 Encounter for screening for malignant neoplasm of prostate: Secondary | ICD-10-CM | POA: Diagnosis not present

## 2022-08-22 DIAGNOSIS — M1712 Unilateral primary osteoarthritis, left knee: Secondary | ICD-10-CM | POA: Diagnosis not present

## 2022-08-22 DIAGNOSIS — E119 Type 2 diabetes mellitus without complications: Secondary | ICD-10-CM | POA: Diagnosis not present

## 2022-08-22 DIAGNOSIS — E039 Hypothyroidism, unspecified: Secondary | ICD-10-CM | POA: Diagnosis not present

## 2022-08-22 DIAGNOSIS — M1711 Unilateral primary osteoarthritis, right knee: Secondary | ICD-10-CM | POA: Diagnosis not present

## 2022-08-22 DIAGNOSIS — G500 Trigeminal neuralgia: Secondary | ICD-10-CM | POA: Diagnosis not present

## 2022-10-04 DIAGNOSIS — Z79899 Other long term (current) drug therapy: Secondary | ICD-10-CM | POA: Diagnosis not present

## 2022-10-04 DIAGNOSIS — I4891 Unspecified atrial fibrillation: Secondary | ICD-10-CM | POA: Diagnosis not present

## 2022-10-04 DIAGNOSIS — R001 Bradycardia, unspecified: Secondary | ICD-10-CM | POA: Diagnosis not present

## 2022-10-04 DIAGNOSIS — I10 Essential (primary) hypertension: Secondary | ICD-10-CM | POA: Diagnosis not present

## 2022-10-04 DIAGNOSIS — I4819 Other persistent atrial fibrillation: Secondary | ICD-10-CM | POA: Diagnosis not present

## 2022-10-04 DIAGNOSIS — R9431 Abnormal electrocardiogram [ECG] [EKG]: Secondary | ICD-10-CM | POA: Diagnosis not present

## 2022-10-04 DIAGNOSIS — I440 Atrioventricular block, first degree: Secondary | ICD-10-CM | POA: Diagnosis not present

## 2022-10-05 DIAGNOSIS — I4891 Unspecified atrial fibrillation: Secondary | ICD-10-CM | POA: Diagnosis not present

## 2022-10-13 DIAGNOSIS — E119 Type 2 diabetes mellitus without complications: Secondary | ICD-10-CM | POA: Diagnosis not present

## 2022-10-13 DIAGNOSIS — I4891 Unspecified atrial fibrillation: Secondary | ICD-10-CM | POA: Diagnosis not present

## 2022-10-13 DIAGNOSIS — E039 Hypothyroidism, unspecified: Secondary | ICD-10-CM | POA: Diagnosis not present

## 2022-10-13 DIAGNOSIS — I10 Essential (primary) hypertension: Secondary | ICD-10-CM | POA: Diagnosis not present

## 2022-10-19 DIAGNOSIS — M170 Bilateral primary osteoarthritis of knee: Secondary | ICD-10-CM | POA: Diagnosis not present

## 2022-10-19 DIAGNOSIS — Z6841 Body Mass Index (BMI) 40.0 and over, adult: Secondary | ICD-10-CM | POA: Diagnosis not present

## 2022-10-19 DIAGNOSIS — G8929 Other chronic pain: Secondary | ICD-10-CM | POA: Diagnosis not present

## 2022-10-19 DIAGNOSIS — Z79891 Long term (current) use of opiate analgesic: Secondary | ICD-10-CM | POA: Diagnosis not present

## 2022-10-19 DIAGNOSIS — M545 Low back pain, unspecified: Secondary | ICD-10-CM | POA: Diagnosis not present

## 2022-10-19 DIAGNOSIS — F1190 Opioid use, unspecified, uncomplicated: Secondary | ICD-10-CM | POA: Diagnosis not present

## 2022-11-09 ENCOUNTER — Ambulatory Visit: Payer: PPO | Admitting: Podiatry

## 2022-11-20 ENCOUNTER — Ambulatory Visit: Payer: PPO | Admitting: Podiatry

## 2022-11-20 DIAGNOSIS — G4733 Obstructive sleep apnea (adult) (pediatric): Secondary | ICD-10-CM | POA: Diagnosis not present

## 2022-11-22 DIAGNOSIS — M1712 Unilateral primary osteoarthritis, left knee: Secondary | ICD-10-CM | POA: Diagnosis not present

## 2022-11-22 DIAGNOSIS — E119 Type 2 diabetes mellitus without complications: Secondary | ICD-10-CM | POA: Diagnosis not present

## 2022-11-22 DIAGNOSIS — I4891 Unspecified atrial fibrillation: Secondary | ICD-10-CM | POA: Diagnosis not present

## 2022-11-22 DIAGNOSIS — E039 Hypothyroidism, unspecified: Secondary | ICD-10-CM | POA: Diagnosis not present

## 2022-11-22 DIAGNOSIS — R609 Edema, unspecified: Secondary | ICD-10-CM | POA: Diagnosis not present

## 2022-11-22 DIAGNOSIS — G629 Polyneuropathy, unspecified: Secondary | ICD-10-CM | POA: Diagnosis not present

## 2022-11-22 DIAGNOSIS — G2581 Restless legs syndrome: Secondary | ICD-10-CM | POA: Diagnosis not present

## 2022-11-22 DIAGNOSIS — Z86718 Personal history of other venous thrombosis and embolism: Secondary | ICD-10-CM | POA: Diagnosis not present

## 2022-11-22 DIAGNOSIS — K219 Gastro-esophageal reflux disease without esophagitis: Secondary | ICD-10-CM | POA: Diagnosis not present

## 2022-11-22 DIAGNOSIS — M1711 Unilateral primary osteoarthritis, right knee: Secondary | ICD-10-CM | POA: Diagnosis not present

## 2022-11-22 DIAGNOSIS — G500 Trigeminal neuralgia: Secondary | ICD-10-CM | POA: Diagnosis not present

## 2022-11-22 DIAGNOSIS — I10 Essential (primary) hypertension: Secondary | ICD-10-CM | POA: Diagnosis not present

## 2022-12-25 ENCOUNTER — Ambulatory Visit: Payer: PPO | Admitting: Podiatry

## 2023-01-17 DIAGNOSIS — G8929 Other chronic pain: Secondary | ICD-10-CM | POA: Diagnosis not present

## 2023-01-17 DIAGNOSIS — Z79891 Long term (current) use of opiate analgesic: Secondary | ICD-10-CM | POA: Diagnosis not present

## 2023-01-17 DIAGNOSIS — F1190 Opioid use, unspecified, uncomplicated: Secondary | ICD-10-CM | POA: Diagnosis not present

## 2023-01-17 DIAGNOSIS — M170 Bilateral primary osteoarthritis of knee: Secondary | ICD-10-CM | POA: Diagnosis not present

## 2023-01-17 DIAGNOSIS — M545 Low back pain, unspecified: Secondary | ICD-10-CM | POA: Diagnosis not present

## 2023-01-17 DIAGNOSIS — Z6841 Body Mass Index (BMI) 40.0 and over, adult: Secondary | ICD-10-CM | POA: Diagnosis not present

## 2023-01-17 DIAGNOSIS — Z5181 Encounter for therapeutic drug level monitoring: Secondary | ICD-10-CM | POA: Diagnosis not present

## 2023-02-26 DIAGNOSIS — K219 Gastro-esophageal reflux disease without esophagitis: Secondary | ICD-10-CM | POA: Diagnosis not present

## 2023-02-26 DIAGNOSIS — Z86718 Personal history of other venous thrombosis and embolism: Secondary | ICD-10-CM | POA: Diagnosis not present

## 2023-02-26 DIAGNOSIS — M1712 Unilateral primary osteoarthritis, left knee: Secondary | ICD-10-CM | POA: Diagnosis not present

## 2023-02-26 DIAGNOSIS — R609 Edema, unspecified: Secondary | ICD-10-CM | POA: Diagnosis not present

## 2023-02-26 DIAGNOSIS — G629 Polyneuropathy, unspecified: Secondary | ICD-10-CM | POA: Diagnosis not present

## 2023-02-26 DIAGNOSIS — G500 Trigeminal neuralgia: Secondary | ICD-10-CM | POA: Diagnosis not present

## 2023-02-26 DIAGNOSIS — I4891 Unspecified atrial fibrillation: Secondary | ICD-10-CM | POA: Diagnosis not present

## 2023-02-26 DIAGNOSIS — E119 Type 2 diabetes mellitus without complications: Secondary | ICD-10-CM | POA: Diagnosis not present

## 2023-02-26 DIAGNOSIS — E039 Hypothyroidism, unspecified: Secondary | ICD-10-CM | POA: Diagnosis not present

## 2023-02-26 DIAGNOSIS — G2581 Restless legs syndrome: Secondary | ICD-10-CM | POA: Diagnosis not present

## 2023-02-26 DIAGNOSIS — M1711 Unilateral primary osteoarthritis, right knee: Secondary | ICD-10-CM | POA: Diagnosis not present

## 2023-02-26 DIAGNOSIS — I10 Essential (primary) hypertension: Secondary | ICD-10-CM | POA: Diagnosis not present

## 2023-03-18 DIAGNOSIS — L03119 Cellulitis of unspecified part of limb: Secondary | ICD-10-CM | POA: Diagnosis not present

## 2023-03-18 DIAGNOSIS — E1129 Type 2 diabetes mellitus with other diabetic kidney complication: Secondary | ICD-10-CM | POA: Diagnosis not present

## 2023-03-23 DIAGNOSIS — R609 Edema, unspecified: Secondary | ICD-10-CM | POA: Diagnosis not present

## 2023-03-23 DIAGNOSIS — E119 Type 2 diabetes mellitus without complications: Secondary | ICD-10-CM | POA: Diagnosis not present

## 2023-03-23 DIAGNOSIS — L03116 Cellulitis of left lower limb: Secondary | ICD-10-CM | POA: Diagnosis not present

## 2023-03-23 DIAGNOSIS — Z23 Encounter for immunization: Secondary | ICD-10-CM | POA: Diagnosis not present

## 2023-04-18 DIAGNOSIS — Z79891 Long term (current) use of opiate analgesic: Secondary | ICD-10-CM | POA: Diagnosis not present

## 2023-04-18 DIAGNOSIS — F1190 Opioid use, unspecified, uncomplicated: Secondary | ICD-10-CM | POA: Diagnosis not present

## 2023-04-18 DIAGNOSIS — M545 Low back pain, unspecified: Secondary | ICD-10-CM | POA: Diagnosis not present

## 2023-04-18 DIAGNOSIS — G8929 Other chronic pain: Secondary | ICD-10-CM | POA: Diagnosis not present

## 2023-04-18 DIAGNOSIS — M170 Bilateral primary osteoarthritis of knee: Secondary | ICD-10-CM | POA: Diagnosis not present

## 2023-04-18 DIAGNOSIS — Z6841 Body Mass Index (BMI) 40.0 and over, adult: Secondary | ICD-10-CM | POA: Diagnosis not present

## 2023-05-28 DIAGNOSIS — E039 Hypothyroidism, unspecified: Secondary | ICD-10-CM | POA: Diagnosis not present

## 2023-05-28 DIAGNOSIS — G2581 Restless legs syndrome: Secondary | ICD-10-CM | POA: Diagnosis not present

## 2023-05-28 DIAGNOSIS — E1140 Type 2 diabetes mellitus with diabetic neuropathy, unspecified: Secondary | ICD-10-CM | POA: Diagnosis not present

## 2023-05-28 DIAGNOSIS — Z86718 Personal history of other venous thrombosis and embolism: Secondary | ICD-10-CM | POA: Diagnosis not present

## 2023-05-28 DIAGNOSIS — K219 Gastro-esophageal reflux disease without esophagitis: Secondary | ICD-10-CM | POA: Diagnosis not present

## 2023-05-28 DIAGNOSIS — M1711 Unilateral primary osteoarthritis, right knee: Secondary | ICD-10-CM | POA: Diagnosis not present

## 2023-05-28 DIAGNOSIS — G629 Polyneuropathy, unspecified: Secondary | ICD-10-CM | POA: Diagnosis not present

## 2023-05-28 DIAGNOSIS — M1712 Unilateral primary osteoarthritis, left knee: Secondary | ICD-10-CM | POA: Diagnosis not present

## 2023-05-28 DIAGNOSIS — I4891 Unspecified atrial fibrillation: Secondary | ICD-10-CM | POA: Diagnosis not present

## 2023-05-28 DIAGNOSIS — G500 Trigeminal neuralgia: Secondary | ICD-10-CM | POA: Diagnosis not present

## 2023-05-28 DIAGNOSIS — I10 Essential (primary) hypertension: Secondary | ICD-10-CM | POA: Diagnosis not present

## 2023-05-28 DIAGNOSIS — R609 Edema, unspecified: Secondary | ICD-10-CM | POA: Diagnosis not present

## 2023-05-31 DIAGNOSIS — L0390 Cellulitis, unspecified: Secondary | ICD-10-CM | POA: Diagnosis not present

## 2023-06-01 DIAGNOSIS — L0390 Cellulitis, unspecified: Secondary | ICD-10-CM | POA: Diagnosis not present

## 2023-06-16 DIAGNOSIS — L03116 Cellulitis of left lower limb: Secondary | ICD-10-CM | POA: Diagnosis not present

## 2023-06-16 DIAGNOSIS — E1129 Type 2 diabetes mellitus with other diabetic kidney complication: Secondary | ICD-10-CM | POA: Diagnosis not present

## 2023-07-12 DIAGNOSIS — Z6841 Body Mass Index (BMI) 40.0 and over, adult: Secondary | ICD-10-CM | POA: Diagnosis not present

## 2023-07-12 DIAGNOSIS — M1710 Unilateral primary osteoarthritis, unspecified knee: Secondary | ICD-10-CM | POA: Diagnosis not present

## 2023-07-12 DIAGNOSIS — G4733 Obstructive sleep apnea (adult) (pediatric): Secondary | ICD-10-CM | POA: Diagnosis not present

## 2023-07-12 DIAGNOSIS — Z885 Allergy status to narcotic agent status: Secondary | ICD-10-CM | POA: Diagnosis not present

## 2023-07-12 DIAGNOSIS — I4891 Unspecified atrial fibrillation: Secondary | ICD-10-CM | POA: Diagnosis not present

## 2023-07-12 DIAGNOSIS — Z86718 Personal history of other venous thrombosis and embolism: Secondary | ICD-10-CM | POA: Diagnosis not present

## 2023-07-12 DIAGNOSIS — Z79899 Other long term (current) drug therapy: Secondary | ICD-10-CM | POA: Diagnosis not present

## 2023-07-12 DIAGNOSIS — I4819 Other persistent atrial fibrillation: Secondary | ICD-10-CM | POA: Diagnosis not present

## 2023-07-12 DIAGNOSIS — I10 Essential (primary) hypertension: Secondary | ICD-10-CM | POA: Diagnosis not present

## 2023-07-13 DIAGNOSIS — I4819 Other persistent atrial fibrillation: Secondary | ICD-10-CM | POA: Diagnosis not present

## 2023-07-27 DIAGNOSIS — M25511 Pain in right shoulder: Secondary | ICD-10-CM | POA: Diagnosis not present

## 2023-07-27 DIAGNOSIS — E1140 Type 2 diabetes mellitus with diabetic neuropathy, unspecified: Secondary | ICD-10-CM | POA: Diagnosis not present

## 2023-07-27 DIAGNOSIS — M25512 Pain in left shoulder: Secondary | ICD-10-CM | POA: Diagnosis not present

## 2023-07-31 DIAGNOSIS — Z79891 Long term (current) use of opiate analgesic: Secondary | ICD-10-CM | POA: Diagnosis not present

## 2023-07-31 DIAGNOSIS — M545 Low back pain, unspecified: Secondary | ICD-10-CM | POA: Diagnosis not present

## 2023-07-31 DIAGNOSIS — M170 Bilateral primary osteoarthritis of knee: Secondary | ICD-10-CM | POA: Diagnosis not present

## 2023-07-31 DIAGNOSIS — Z7901 Long term (current) use of anticoagulants: Secondary | ICD-10-CM | POA: Diagnosis not present

## 2023-07-31 DIAGNOSIS — G8929 Other chronic pain: Secondary | ICD-10-CM | POA: Diagnosis not present

## 2023-07-31 DIAGNOSIS — F1190 Opioid use, unspecified, uncomplicated: Secondary | ICD-10-CM | POA: Diagnosis not present

## 2023-07-31 DIAGNOSIS — Z6841 Body Mass Index (BMI) 40.0 and over, adult: Secondary | ICD-10-CM | POA: Diagnosis not present

## 2023-08-14 DIAGNOSIS — L03116 Cellulitis of left lower limb: Secondary | ICD-10-CM | POA: Diagnosis not present

## 2023-08-14 DIAGNOSIS — E1140 Type 2 diabetes mellitus with diabetic neuropathy, unspecified: Secondary | ICD-10-CM | POA: Diagnosis not present

## 2023-08-28 DIAGNOSIS — E039 Hypothyroidism, unspecified: Secondary | ICD-10-CM | POA: Diagnosis not present

## 2023-08-28 DIAGNOSIS — G629 Polyneuropathy, unspecified: Secondary | ICD-10-CM | POA: Diagnosis not present

## 2023-08-28 DIAGNOSIS — R609 Edema, unspecified: Secondary | ICD-10-CM | POA: Diagnosis not present

## 2023-08-28 DIAGNOSIS — I4891 Unspecified atrial fibrillation: Secondary | ICD-10-CM | POA: Diagnosis not present

## 2023-08-28 DIAGNOSIS — Z79899 Other long term (current) drug therapy: Secondary | ICD-10-CM | POA: Diagnosis not present

## 2023-08-28 DIAGNOSIS — Z86718 Personal history of other venous thrombosis and embolism: Secondary | ICD-10-CM | POA: Diagnosis not present

## 2023-08-28 DIAGNOSIS — E1140 Type 2 diabetes mellitus with diabetic neuropathy, unspecified: Secondary | ICD-10-CM | POA: Diagnosis not present

## 2023-08-28 DIAGNOSIS — I10 Essential (primary) hypertension: Secondary | ICD-10-CM | POA: Diagnosis not present

## 2023-08-28 DIAGNOSIS — M1711 Unilateral primary osteoarthritis, right knee: Secondary | ICD-10-CM | POA: Diagnosis not present

## 2023-08-28 DIAGNOSIS — G500 Trigeminal neuralgia: Secondary | ICD-10-CM | POA: Diagnosis not present

## 2023-08-28 DIAGNOSIS — Z125 Encounter for screening for malignant neoplasm of prostate: Secondary | ICD-10-CM | POA: Diagnosis not present

## 2023-08-28 DIAGNOSIS — E7800 Pure hypercholesterolemia, unspecified: Secondary | ICD-10-CM | POA: Diagnosis not present

## 2023-08-28 DIAGNOSIS — K219 Gastro-esophageal reflux disease without esophagitis: Secondary | ICD-10-CM | POA: Diagnosis not present

## 2023-09-03 DIAGNOSIS — E538 Deficiency of other specified B group vitamins: Secondary | ICD-10-CM | POA: Diagnosis not present

## 2023-09-03 DIAGNOSIS — S91302A Unspecified open wound, left foot, initial encounter: Secondary | ICD-10-CM | POA: Diagnosis not present

## 2023-09-03 DIAGNOSIS — E1140 Type 2 diabetes mellitus with diabetic neuropathy, unspecified: Secondary | ICD-10-CM | POA: Diagnosis not present

## 2023-09-03 DIAGNOSIS — L03116 Cellulitis of left lower limb: Secondary | ICD-10-CM | POA: Diagnosis not present

## 2023-09-03 DIAGNOSIS — E039 Hypothyroidism, unspecified: Secondary | ICD-10-CM | POA: Diagnosis not present

## 2023-10-01 DIAGNOSIS — L03116 Cellulitis of left lower limb: Secondary | ICD-10-CM | POA: Diagnosis not present

## 2023-10-28 DIAGNOSIS — E119 Type 2 diabetes mellitus without complications: Secondary | ICD-10-CM | POA: Diagnosis not present

## 2023-10-28 DIAGNOSIS — N390 Urinary tract infection, site not specified: Secondary | ICD-10-CM | POA: Diagnosis not present

## 2023-10-28 DIAGNOSIS — R300 Dysuria: Secondary | ICD-10-CM | POA: Diagnosis not present

## 2023-10-30 DIAGNOSIS — F1190 Opioid use, unspecified, uncomplicated: Secondary | ICD-10-CM | POA: Diagnosis not present

## 2023-10-30 DIAGNOSIS — Z5181 Encounter for therapeutic drug level monitoring: Secondary | ICD-10-CM | POA: Diagnosis not present

## 2023-10-30 DIAGNOSIS — G8929 Other chronic pain: Secondary | ICD-10-CM | POA: Diagnosis not present

## 2023-10-30 DIAGNOSIS — R030 Elevated blood-pressure reading, without diagnosis of hypertension: Secondary | ICD-10-CM | POA: Diagnosis not present

## 2023-10-30 DIAGNOSIS — Z79891 Long term (current) use of opiate analgesic: Secondary | ICD-10-CM | POA: Diagnosis not present

## 2023-10-30 DIAGNOSIS — M170 Bilateral primary osteoarthritis of knee: Secondary | ICD-10-CM | POA: Diagnosis not present

## 2023-10-30 DIAGNOSIS — M545 Low back pain, unspecified: Secondary | ICD-10-CM | POA: Diagnosis not present

## 2023-10-30 DIAGNOSIS — Z7901 Long term (current) use of anticoagulants: Secondary | ICD-10-CM | POA: Diagnosis not present

## 2023-10-30 DIAGNOSIS — Z6841 Body Mass Index (BMI) 40.0 and over, adult: Secondary | ICD-10-CM | POA: Diagnosis not present

## 2023-11-07 DIAGNOSIS — R1084 Generalized abdominal pain: Secondary | ICD-10-CM | POA: Diagnosis not present

## 2023-11-08 DIAGNOSIS — K5732 Diverticulitis of large intestine without perforation or abscess without bleeding: Secondary | ICD-10-CM | POA: Diagnosis not present

## 2023-11-08 DIAGNOSIS — G4733 Obstructive sleep apnea (adult) (pediatric): Secondary | ICD-10-CM | POA: Diagnosis not present

## 2023-11-08 DIAGNOSIS — Z9989 Dependence on other enabling machines and devices: Secondary | ICD-10-CM | POA: Diagnosis not present

## 2023-11-08 DIAGNOSIS — I10 Essential (primary) hypertension: Secondary | ICD-10-CM | POA: Diagnosis not present

## 2023-11-08 DIAGNOSIS — R1032 Left lower quadrant pain: Secondary | ICD-10-CM | POA: Diagnosis not present

## 2023-11-19 ENCOUNTER — Ambulatory Visit (INDEPENDENT_AMBULATORY_CARE_PROVIDER_SITE_OTHER): Admitting: Podiatry

## 2023-11-19 DIAGNOSIS — Z91198 Patient's noncompliance with other medical treatment and regimen for other reason: Secondary | ICD-10-CM

## 2023-11-22 NOTE — Progress Notes (Signed)
 1. Failure to attend appointment with reason given    Appointment canceled and rescheduled by patient.

## 2023-12-04 DIAGNOSIS — Z9049 Acquired absence of other specified parts of digestive tract: Secondary | ICD-10-CM | POA: Diagnosis not present

## 2023-12-04 DIAGNOSIS — Z6841 Body Mass Index (BMI) 40.0 and over, adult: Secondary | ICD-10-CM | POA: Diagnosis not present

## 2023-12-04 DIAGNOSIS — Z7985 Long-term (current) use of injectable non-insulin antidiabetic drugs: Secondary | ICD-10-CM | POA: Diagnosis not present

## 2023-12-04 DIAGNOSIS — I4891 Unspecified atrial fibrillation: Secondary | ICD-10-CM | POA: Diagnosis not present

## 2023-12-04 DIAGNOSIS — G8929 Other chronic pain: Secondary | ICD-10-CM | POA: Diagnosis not present

## 2023-12-04 DIAGNOSIS — E079 Disorder of thyroid, unspecified: Secondary | ICD-10-CM | POA: Diagnosis not present

## 2023-12-04 DIAGNOSIS — I10 Essential (primary) hypertension: Secondary | ICD-10-CM | POA: Diagnosis not present

## 2023-12-04 DIAGNOSIS — Z7902 Long term (current) use of antithrombotics/antiplatelets: Secondary | ICD-10-CM | POA: Diagnosis not present

## 2023-12-04 DIAGNOSIS — Z79899 Other long term (current) drug therapy: Secondary | ICD-10-CM | POA: Diagnosis not present

## 2023-12-04 DIAGNOSIS — K631 Perforation of intestine (nontraumatic): Secondary | ICD-10-CM | POA: Diagnosis not present

## 2023-12-04 DIAGNOSIS — R1032 Left lower quadrant pain: Secondary | ICD-10-CM | POA: Diagnosis not present

## 2023-12-04 DIAGNOSIS — K5720 Diverticulitis of large intestine with perforation and abscess without bleeding: Secondary | ICD-10-CM | POA: Diagnosis not present

## 2023-12-04 DIAGNOSIS — Z79890 Hormone replacement therapy (postmenopausal): Secondary | ICD-10-CM | POA: Diagnosis not present

## 2023-12-04 DIAGNOSIS — Z7901 Long term (current) use of anticoagulants: Secondary | ICD-10-CM | POA: Diagnosis not present

## 2023-12-04 DIAGNOSIS — K5732 Diverticulitis of large intestine without perforation or abscess without bleeding: Secondary | ICD-10-CM | POA: Diagnosis not present

## 2023-12-05 ENCOUNTER — Encounter (HOSPITAL_COMMUNITY): Payer: Self-pay

## 2023-12-05 DIAGNOSIS — I878 Other specified disorders of veins: Secondary | ICD-10-CM | POA: Diagnosis not present

## 2023-12-05 DIAGNOSIS — E662 Morbid (severe) obesity with alveolar hypoventilation: Secondary | ICD-10-CM | POA: Diagnosis not present

## 2023-12-05 DIAGNOSIS — K5792 Diverticulitis of intestine, part unspecified, without perforation or abscess without bleeding: Secondary | ICD-10-CM | POA: Diagnosis not present

## 2023-12-05 DIAGNOSIS — I110 Hypertensive heart disease with heart failure: Secondary | ICD-10-CM | POA: Diagnosis not present

## 2023-12-05 DIAGNOSIS — Z86718 Personal history of other venous thrombosis and embolism: Secondary | ICD-10-CM | POA: Diagnosis not present

## 2023-12-05 DIAGNOSIS — E8809 Other disorders of plasma-protein metabolism, not elsewhere classified: Secondary | ICD-10-CM | POA: Diagnosis not present

## 2023-12-05 DIAGNOSIS — Z860101 Personal history of adenomatous and serrated colon polyps: Secondary | ICD-10-CM | POA: Diagnosis not present

## 2023-12-05 DIAGNOSIS — I4819 Other persistent atrial fibrillation: Secondary | ICD-10-CM | POA: Diagnosis not present

## 2023-12-05 DIAGNOSIS — F1190 Opioid use, unspecified, uncomplicated: Secondary | ICD-10-CM | POA: Diagnosis not present

## 2023-12-05 DIAGNOSIS — K5720 Diverticulitis of large intestine with perforation and abscess without bleeding: Secondary | ICD-10-CM | POA: Diagnosis not present

## 2023-12-05 DIAGNOSIS — Z7985 Long-term (current) use of injectable non-insulin antidiabetic drugs: Secondary | ICD-10-CM | POA: Diagnosis not present

## 2023-12-05 DIAGNOSIS — I4811 Longstanding persistent atrial fibrillation: Secondary | ICD-10-CM | POA: Diagnosis not present

## 2023-12-05 DIAGNOSIS — E876 Hypokalemia: Secondary | ICD-10-CM | POA: Diagnosis not present

## 2023-12-05 DIAGNOSIS — R109 Unspecified abdominal pain: Secondary | ICD-10-CM | POA: Diagnosis not present

## 2023-12-05 DIAGNOSIS — R1032 Left lower quadrant pain: Secondary | ICD-10-CM | POA: Diagnosis not present

## 2023-12-05 DIAGNOSIS — Z8719 Personal history of other diseases of the digestive system: Secondary | ICD-10-CM | POA: Diagnosis not present

## 2023-12-05 DIAGNOSIS — G8929 Other chronic pain: Secondary | ICD-10-CM | POA: Diagnosis not present

## 2023-12-05 DIAGNOSIS — R600 Localized edema: Secondary | ICD-10-CM | POA: Diagnosis not present

## 2023-12-05 DIAGNOSIS — E039 Hypothyroidism, unspecified: Secondary | ICD-10-CM | POA: Diagnosis not present

## 2023-12-05 DIAGNOSIS — E119 Type 2 diabetes mellitus without complications: Secondary | ICD-10-CM | POA: Diagnosis not present

## 2023-12-05 DIAGNOSIS — I10 Essential (primary) hypertension: Secondary | ICD-10-CM | POA: Diagnosis not present

## 2023-12-05 DIAGNOSIS — M545 Low back pain, unspecified: Secondary | ICD-10-CM | POA: Diagnosis not present

## 2023-12-05 DIAGNOSIS — I872 Venous insufficiency (chronic) (peripheral): Secondary | ICD-10-CM | POA: Diagnosis not present

## 2023-12-05 DIAGNOSIS — K5732 Diverticulitis of large intestine without perforation or abscess without bleeding: Secondary | ICD-10-CM | POA: Diagnosis not present

## 2023-12-05 DIAGNOSIS — Z79891 Long term (current) use of opiate analgesic: Secondary | ICD-10-CM | POA: Diagnosis not present

## 2023-12-05 DIAGNOSIS — E785 Hyperlipidemia, unspecified: Secondary | ICD-10-CM | POA: Diagnosis not present

## 2023-12-05 DIAGNOSIS — E66813 Obesity, class 3: Secondary | ICD-10-CM | POA: Diagnosis not present

## 2023-12-05 DIAGNOSIS — G629 Polyneuropathy, unspecified: Secondary | ICD-10-CM | POA: Diagnosis not present

## 2023-12-05 DIAGNOSIS — Z86711 Personal history of pulmonary embolism: Secondary | ICD-10-CM | POA: Diagnosis not present

## 2023-12-05 DIAGNOSIS — I509 Heart failure, unspecified: Secondary | ICD-10-CM | POA: Diagnosis not present

## 2023-12-05 DIAGNOSIS — G4733 Obstructive sleep apnea (adult) (pediatric): Secondary | ICD-10-CM | POA: Diagnosis not present

## 2023-12-05 DIAGNOSIS — J449 Chronic obstructive pulmonary disease, unspecified: Secondary | ICD-10-CM | POA: Diagnosis not present

## 2023-12-05 DIAGNOSIS — Z79899 Other long term (current) drug therapy: Secondary | ICD-10-CM | POA: Diagnosis not present

## 2023-12-05 DIAGNOSIS — I4891 Unspecified atrial fibrillation: Secondary | ICD-10-CM | POA: Diagnosis not present

## 2023-12-05 DIAGNOSIS — Z7901 Long term (current) use of anticoagulants: Secondary | ICD-10-CM | POA: Diagnosis not present

## 2023-12-12 DIAGNOSIS — R609 Edema, unspecified: Secondary | ICD-10-CM | POA: Diagnosis not present

## 2023-12-12 DIAGNOSIS — E1140 Type 2 diabetes mellitus with diabetic neuropathy, unspecified: Secondary | ICD-10-CM | POA: Diagnosis not present

## 2023-12-12 DIAGNOSIS — M1711 Unilateral primary osteoarthritis, right knee: Secondary | ICD-10-CM | POA: Diagnosis not present

## 2023-12-12 DIAGNOSIS — I4891 Unspecified atrial fibrillation: Secondary | ICD-10-CM | POA: Diagnosis not present

## 2023-12-12 DIAGNOSIS — G629 Polyneuropathy, unspecified: Secondary | ICD-10-CM | POA: Diagnosis not present

## 2023-12-12 DIAGNOSIS — K5720 Diverticulitis of large intestine with perforation and abscess without bleeding: Secondary | ICD-10-CM | POA: Diagnosis not present

## 2023-12-12 DIAGNOSIS — I10 Essential (primary) hypertension: Secondary | ICD-10-CM | POA: Diagnosis not present

## 2023-12-12 DIAGNOSIS — E7800 Pure hypercholesterolemia, unspecified: Secondary | ICD-10-CM | POA: Diagnosis not present

## 2023-12-12 DIAGNOSIS — Z86718 Personal history of other venous thrombosis and embolism: Secondary | ICD-10-CM | POA: Diagnosis not present

## 2023-12-12 DIAGNOSIS — E039 Hypothyroidism, unspecified: Secondary | ICD-10-CM | POA: Diagnosis not present

## 2023-12-12 DIAGNOSIS — G500 Trigeminal neuralgia: Secondary | ICD-10-CM | POA: Diagnosis not present

## 2023-12-12 DIAGNOSIS — M1712 Unilateral primary osteoarthritis, left knee: Secondary | ICD-10-CM | POA: Diagnosis not present

## 2023-12-27 ENCOUNTER — Ambulatory Visit: Admitting: Podiatry

## 2023-12-27 ENCOUNTER — Encounter: Payer: Self-pay | Admitting: Podiatry

## 2023-12-27 DIAGNOSIS — B351 Tinea unguium: Secondary | ICD-10-CM

## 2023-12-27 DIAGNOSIS — M79676 Pain in unspecified toe(s): Secondary | ICD-10-CM

## 2023-12-27 DIAGNOSIS — D689 Coagulation defect, unspecified: Secondary | ICD-10-CM

## 2023-12-27 NOTE — Patient Instructions (Signed)
   For dry or cracked skin, recommend daily use of Bag Balm Hand and Body Moistuizer which may be purchased at local drug store or on [employer].

## 2023-12-30 NOTE — Progress Notes (Signed)
  Subjective:  Patient ID: Jon Carson, male    DOB: 07-29-1965,  MRN: [id number]  Jon Carson presents to clinic today for at risk foot care with h/o coagulation defect and painful thick toenails that are difficult to trim. Pain interferes with ambulation. Aggravating factors include wearing enclosed shoe gear. Pain is relieved with periodic professional debridement. Patient states he had episode of cellulitis due to cracked skin under his toes. Chief Complaint  Patient presents with   Nail Problem    Thick painful toenails, 3 month follow up (overdue)    New problem(s): None.   PCP is Montey Lot, PA-C.  Allergies  Allergen Reactions   Raspberry Anaphylaxis   Cefdinir Other (See Comments)    Abdominal pain   Percocet [Oxycodone -Acetaminophen ] Other (See Comments)    SEVERE CONSTIPATION    Review of Systems: Negative except as noted in the HPI.  Objective: No changes noted in today's physical examination. There were no vitals filed for this visit. Jon Carson is a pleasant 58 y.o. male morbidly obese in NAD. AAO x 3.  Vascular Examination: CFT <4 seconds b/l. Pedal pulses diminished b/l due to edema b/l. Digital hair absent. Skin temperature gradient warm to cool b/l. No ischemia or gangrene. No cyanosis or clubbing noted b/l. Nonpitting edema noted BLE.   Neurological Examination: Sensation grossly intact b/l with 10 gram monofilament. Vibratory sensation intact b/l.   Dermatological Examination: No open wounds. No interdigital macerations.   Toenails 1-5 b/l thick, discolored, elongated with subungual debris and pain on dorsal palpation.   No hyperkeratotic nor porokeratotic lesions.  Musculoskeletal Examination: Muscle strength 5/5 to all lower extremity muscle groups bilaterally. No pain, crepitus or joint limitation noted with ROM bilateral LE. No gross bony deformities bilaterally.  Radiographs: None  Assessment/Plan: 1. Pain due to  onychomycosis of toenail   2. Coagulation defect [facility])    -Patient was evaluated today. All questions/concerns addressed on today's visit. -Toenails 1-5 b/l were debrided in length and girth with sterile nail nippers and dremel without iatrogenic bleeding.  -For dry skin, recommended daily use of Bag Balm Hand and Body Moistuizer which may be purchased at local drug store or on [employer]. -Patient/POA to call should there be question/concern in the interim.   Return in about 3 months (around 03/28/2024).  Jon Carson, DPM      [facility] LOCATION: [address], [place] [address]                   Office [phone]   [facility] LOCATION: [address] [place], [place] [address] Office [phone]

## 2024-01-10 DIAGNOSIS — E079 Disorder of thyroid, unspecified: Secondary | ICD-10-CM | POA: Diagnosis not present

## 2024-01-10 DIAGNOSIS — E66813 Obesity, class 3: Secondary | ICD-10-CM | POA: Diagnosis not present

## 2024-01-10 DIAGNOSIS — M1710 Unilateral primary osteoarthritis, unspecified knee: Secondary | ICD-10-CM | POA: Diagnosis not present

## 2024-01-10 DIAGNOSIS — R5383 Other fatigue: Secondary | ICD-10-CM | POA: Diagnosis not present

## 2024-01-10 DIAGNOSIS — Z86711 Personal history of pulmonary embolism: Secondary | ICD-10-CM | POA: Diagnosis not present

## 2024-01-10 DIAGNOSIS — Z79890 Hormone replacement therapy (postmenopausal): Secondary | ICD-10-CM | POA: Diagnosis not present

## 2024-01-10 DIAGNOSIS — Z7901 Long term (current) use of anticoagulants: Secondary | ICD-10-CM | POA: Diagnosis not present

## 2024-01-10 DIAGNOSIS — I10 Essential (primary) hypertension: Secondary | ICD-10-CM | POA: Diagnosis not present

## 2024-01-10 DIAGNOSIS — Z86718 Personal history of other venous thrombosis and embolism: Secondary | ICD-10-CM | POA: Diagnosis not present

## 2024-01-10 DIAGNOSIS — Z7985 Long-term (current) use of injectable non-insulin antidiabetic drugs: Secondary | ICD-10-CM | POA: Diagnosis not present

## 2024-01-10 DIAGNOSIS — Z8249 Family history of ischemic heart disease and other diseases of the circulatory system: Secondary | ICD-10-CM | POA: Diagnosis not present

## 2024-01-10 DIAGNOSIS — G4733 Obstructive sleep apnea (adult) (pediatric): Secondary | ICD-10-CM | POA: Diagnosis not present

## 2024-01-10 DIAGNOSIS — Z885 Allergy status to narcotic agent status: Secondary | ICD-10-CM | POA: Diagnosis not present

## 2024-01-10 DIAGNOSIS — Z79899 Other long term (current) drug therapy: Secondary | ICD-10-CM | POA: Diagnosis not present

## 2024-01-10 DIAGNOSIS — I4819 Other persistent atrial fibrillation: Secondary | ICD-10-CM | POA: Diagnosis not present

## 2024-01-10 DIAGNOSIS — Z6841 Body Mass Index (BMI) 40.0 and over, adult: Secondary | ICD-10-CM | POA: Diagnosis not present

## 2024-01-13 DIAGNOSIS — I4819 Other persistent atrial fibrillation: Secondary | ICD-10-CM | POA: Diagnosis not present

## 2024-01-17 ENCOUNTER — Ambulatory Visit: Admitting: Student in an Organized Health Care Education/Training Program

## 2024-01-17 DIAGNOSIS — K219 Gastro-esophageal reflux disease without esophagitis: Secondary | ICD-10-CM | POA: Diagnosis not present

## 2024-01-17 DIAGNOSIS — Z86711 Personal history of pulmonary embolism: Secondary | ICD-10-CM | POA: Diagnosis not present

## 2024-01-17 DIAGNOSIS — I10 Essential (primary) hypertension: Secondary | ICD-10-CM | POA: Diagnosis not present

## 2024-01-17 DIAGNOSIS — I440 Atrioventricular block, first degree: Secondary | ICD-10-CM | POA: Diagnosis not present

## 2024-01-17 DIAGNOSIS — E039 Hypothyroidism, unspecified: Secondary | ICD-10-CM | POA: Diagnosis not present

## 2024-01-17 DIAGNOSIS — I4819 Other persistent atrial fibrillation: Secondary | ICD-10-CM | POA: Diagnosis not present

## 2024-01-17 DIAGNOSIS — G4733 Obstructive sleep apnea (adult) (pediatric): Secondary | ICD-10-CM | POA: Diagnosis not present

## 2024-02-13 ENCOUNTER — Ambulatory Visit: Admitting: Gastroenterology

## 2024-03-18 DIAGNOSIS — I10 Essential (primary) hypertension: Secondary | ICD-10-CM | POA: Diagnosis not present

## 2024-03-18 DIAGNOSIS — G500 Trigeminal neuralgia: Secondary | ICD-10-CM | POA: Diagnosis not present

## 2024-03-18 DIAGNOSIS — Z86718 Personal history of other venous thrombosis and embolism: Secondary | ICD-10-CM | POA: Diagnosis not present

## 2024-03-18 DIAGNOSIS — M1711 Unilateral primary osteoarthritis, right knee: Secondary | ICD-10-CM | POA: Diagnosis not present

## 2024-03-18 DIAGNOSIS — M1712 Unilateral primary osteoarthritis, left knee: Secondary | ICD-10-CM | POA: Diagnosis not present

## 2024-03-18 DIAGNOSIS — K219 Gastro-esophageal reflux disease without esophagitis: Secondary | ICD-10-CM | POA: Diagnosis not present

## 2024-03-18 DIAGNOSIS — R609 Edema, unspecified: Secondary | ICD-10-CM | POA: Diagnosis not present

## 2024-03-18 DIAGNOSIS — E7800 Pure hypercholesterolemia, unspecified: Secondary | ICD-10-CM | POA: Diagnosis not present

## 2024-03-18 DIAGNOSIS — I4891 Unspecified atrial fibrillation: Secondary | ICD-10-CM | POA: Diagnosis not present

## 2024-03-18 DIAGNOSIS — E039 Hypothyroidism, unspecified: Secondary | ICD-10-CM | POA: Diagnosis not present

## 2024-03-18 DIAGNOSIS — G629 Polyneuropathy, unspecified: Secondary | ICD-10-CM | POA: Diagnosis not present

## 2024-03-18 DIAGNOSIS — E1140 Type 2 diabetes mellitus with diabetic neuropathy, unspecified: Secondary | ICD-10-CM | POA: Diagnosis not present

## 2024-03-19 DIAGNOSIS — S91119A Laceration without foreign body of unspecified toe without damage to nail, initial encounter: Secondary | ICD-10-CM | POA: Diagnosis not present

## 2024-03-26 DIAGNOSIS — S91119A Laceration without foreign body of unspecified toe without damage to nail, initial encounter: Secondary | ICD-10-CM | POA: Diagnosis not present

## 2024-03-26 DIAGNOSIS — R609 Edema, unspecified: Secondary | ICD-10-CM | POA: Diagnosis not present

## 2024-03-26 DIAGNOSIS — E1140 Type 2 diabetes mellitus with diabetic neuropathy, unspecified: Secondary | ICD-10-CM | POA: Diagnosis not present

## 2024-03-26 DIAGNOSIS — G629 Polyneuropathy, unspecified: Secondary | ICD-10-CM | POA: Diagnosis not present

## 2024-03-28 DIAGNOSIS — I4819 Other persistent atrial fibrillation: Secondary | ICD-10-CM | POA: Diagnosis not present

## 2024-03-28 DIAGNOSIS — I4891 Unspecified atrial fibrillation: Secondary | ICD-10-CM | POA: Diagnosis not present

## 2024-03-28 DIAGNOSIS — G4733 Obstructive sleep apnea (adult) (pediatric): Secondary | ICD-10-CM | POA: Diagnosis not present

## 2024-03-28 DIAGNOSIS — I4892 Unspecified atrial flutter: Secondary | ICD-10-CM | POA: Diagnosis not present

## 2024-03-28 DIAGNOSIS — Z79899 Other long term (current) drug therapy: Secondary | ICD-10-CM | POA: Diagnosis not present

## 2024-03-28 DIAGNOSIS — I10 Essential (primary) hypertension: Secondary | ICD-10-CM | POA: Diagnosis not present

## 2024-03-31 ENCOUNTER — Ambulatory Visit: Admitting: Podiatry

## 2024-04-01 ENCOUNTER — Other Ambulatory Visit: Payer: Self-pay

## 2024-04-02 DIAGNOSIS — S91119A Laceration without foreign body of unspecified toe without damage to nail, initial encounter: Secondary | ICD-10-CM | POA: Diagnosis not present

## 2024-04-02 DIAGNOSIS — G629 Polyneuropathy, unspecified: Secondary | ICD-10-CM | POA: Diagnosis not present

## 2024-04-03 ENCOUNTER — Ambulatory Visit: Admitting: Gastroenterology

## 2024-04-14 ENCOUNTER — Encounter: Payer: Self-pay | Admitting: Podiatry

## 2024-04-14 ENCOUNTER — Ambulatory Visit: Admitting: Podiatry

## 2024-04-14 DIAGNOSIS — S90415A Abrasion, left lesser toe(s), initial encounter: Secondary | ICD-10-CM | POA: Diagnosis not present

## 2024-04-14 DIAGNOSIS — B351 Tinea unguium: Secondary | ICD-10-CM

## 2024-04-14 DIAGNOSIS — D689 Coagulation defect, unspecified: Secondary | ICD-10-CM

## 2024-04-14 DIAGNOSIS — M79676 Pain in unspecified toe(s): Secondary | ICD-10-CM

## 2024-04-21 ENCOUNTER — Encounter: Payer: Self-pay | Admitting: Podiatry

## 2024-04-21 DIAGNOSIS — E039 Hypothyroidism, unspecified: Secondary | ICD-10-CM | POA: Diagnosis not present

## 2024-04-21 DIAGNOSIS — I4891 Unspecified atrial fibrillation: Secondary | ICD-10-CM | POA: Diagnosis not present

## 2024-04-21 DIAGNOSIS — I10 Essential (primary) hypertension: Secondary | ICD-10-CM | POA: Diagnosis not present

## 2024-04-21 NOTE — Progress Notes (Signed)
 Subjective:  Patient ID: Jon Carson, male    DOB: 15-Mar-1966,  MRN: [id number]  Jon Carson presents to clinic today for at risk foot care with h/o coagulation defect and painful thick toenails that are difficult to trim. Pain interferes with ambulation. Aggravating factors include wearing enclosed shoe gear. Pain is relieved with periodic professional debridement.  Chief Complaint  Patient presents with   Toe Pain    RFC. Was seen by Jon Carson in June. He denies being diabetic   New problem(s): Patient states he stubbed his toes while going upstairs. His foot did not completely clear the step and he scraped the bottom of his left 2nd toe. His daughter has been treating it with antibiotic ointment and states it is nearly healed. He also has a scab on the top of his 2nd digit  PCP is Carson Carson, Jon Carson.  Allergies  Allergen Reactions   Raspberry Anaphylaxis   Cefdinir Other (See Comments)    Abdominal pain   Oxycodone      Other Reaction(s): Dizziness  Severe constipation   Oxycodone -Acetaminophen      Other reaction(s): Other (See Comments)  SEVERE CONSTIPATION   Percocet [Oxycodone -Acetaminophen ] Other (See Comments)    SEVERE CONSTIPATION    Review of Systems: Negative except as noted in the HPI.  Objective: No changes noted in today's physical examination. There were no vitals filed for this visit. Jon Carson is a pleasant 58 y.o. male morbidly obese in NAD. AAO x 3.  Vascular Examination: CFT <4 seconds b/l. Pedal pulses diminished b/l due to edema b/l. Digital hair absent. Skin temperature gradient warm to cool b/l. No ischemia or gangrene. No cyanosis or clubbing noted b/l. Nonpitting edema noted BLE.   Neurological Examination: Sensation grossly intact b/l with 10 gram monofilament. Vibratory sensation intact b/l.   Dermatological Examination:  Left great toe with resolving blood blister at IPJ. Left 2nd digit with epithelialization of  abrasion at plantar DIPJ. No erythema, no edema, no drainage, no fluctuance.  Healing scab dorsal aspect left 2nd digit DIPJ which has complete epithelialization underneath. Proximal aspect of area still healing. No erythema, no edema, no fluctuance.   No open wounds. No interdigital macerations.   Toenails 1-5 b/l thick, discolored, elongated with subungual debris and pain on dorsal palpation.   No hyperkeratotic nor porokeratotic lesions.  Musculoskeletal Examination: Muscle strength 5/5 to all lower extremity muscle groups bilaterally. No pain, crepitus or joint limitation noted with ROM bilateral LE. No gross bony deformities bilaterally.  Radiographs: None  Assessment/Plan: 1. Pain due to onychomycosis of toenail   2. Abrasion of second toe of left foot, initial encounter   3. Coagulation defect   -Consent given for treatment as described below: -Examined patient. -Patient to continue local wound care to abrasions once daily. Monitor and call office if condition changes. He related understanding. -Patient to continue soft, supportive shoe gear daily. -Mycotic toenails 1-5 bilaterally were debrided in length and girth with sterile nail nippers and dremel without incident. -Patient/POA to call should there be question/concern in the interim.   Return in about 3 months (around 07/15/2024).  Jon Carson, DPM      [facility] LOCATION: [address]                                                 [place],  [place] [address]                   Office [phone]   [facility] LOCATION: [address] [place], [place] [address] Office [phone]

## 2024-04-29 DIAGNOSIS — R001 Bradycardia, unspecified: Secondary | ICD-10-CM | POA: Diagnosis not present

## 2024-04-29 DIAGNOSIS — I10 Essential (primary) hypertension: Secondary | ICD-10-CM | POA: Diagnosis not present

## 2024-04-29 DIAGNOSIS — K219 Gastro-esophageal reflux disease without esophagitis: Secondary | ICD-10-CM | POA: Diagnosis not present

## 2024-04-29 DIAGNOSIS — R9431 Abnormal electrocardiogram [ECG] [EKG]: Secondary | ICD-10-CM | POA: Diagnosis not present

## 2024-04-29 DIAGNOSIS — I4819 Other persistent atrial fibrillation: Secondary | ICD-10-CM | POA: Diagnosis not present

## 2024-04-29 DIAGNOSIS — E039 Hypothyroidism, unspecified: Secondary | ICD-10-CM | POA: Diagnosis not present

## 2024-04-29 DIAGNOSIS — Z6841 Body Mass Index (BMI) 40.0 and over, adult: Secondary | ICD-10-CM | POA: Diagnosis not present

## 2024-04-29 DIAGNOSIS — G4733 Obstructive sleep apnea (adult) (pediatric): Secondary | ICD-10-CM | POA: Diagnosis not present

## 2024-05-20 ENCOUNTER — Ambulatory Visit: Admitting: Gastroenterology
